# Patient Record
Sex: Male | Born: 1935 | Race: White | Hispanic: No | Marital: Married | Smoking: Former smoker
Health system: Southern US, Community
[De-identification: ages and names within clinical notes are randomized; demographics above are authoritative.]

## PROBLEM LIST (undated history)

## (undated) DIAGNOSIS — F0390 Unspecified dementia without behavioral disturbance: Secondary | ICD-10-CM

## (undated) DIAGNOSIS — S42202P Unspecified fracture of upper end of left humerus, subsequent encounter for fracture with malunion: Secondary | ICD-10-CM

## (undated) DIAGNOSIS — F32A Depression, unspecified: Secondary | ICD-10-CM

## (undated) DIAGNOSIS — G20 Parkinson's disease: Secondary | ICD-10-CM

## (undated) DIAGNOSIS — E785 Hyperlipidemia, unspecified: Secondary | ICD-10-CM

## (undated) DIAGNOSIS — R413 Other amnesia: Secondary | ICD-10-CM

## (undated) DIAGNOSIS — I10 Essential (primary) hypertension: Secondary | ICD-10-CM

## (undated) DIAGNOSIS — G43909 Migraine, unspecified, not intractable, without status migrainosus: Secondary | ICD-10-CM

## (undated) DIAGNOSIS — I679 Cerebrovascular disease, unspecified: Secondary | ICD-10-CM

## (undated) DIAGNOSIS — G20A1 Parkinson's disease without dyskinesia, without mention of fluctuations: Secondary | ICD-10-CM

## (undated) DIAGNOSIS — H8110 Benign paroxysmal vertigo, unspecified ear: Secondary | ICD-10-CM

## (undated) DIAGNOSIS — I639 Cerebral infarction, unspecified: Secondary | ICD-10-CM

## (undated) DIAGNOSIS — C61 Malignant neoplasm of prostate: Secondary | ICD-10-CM

## (undated) DIAGNOSIS — I351 Nonrheumatic aortic (valve) insufficiency: Secondary | ICD-10-CM

## (undated) DIAGNOSIS — I4510 Unspecified right bundle-branch block: Secondary | ICD-10-CM

## (undated) DIAGNOSIS — K5790 Diverticulosis of intestine, part unspecified, without perforation or abscess without bleeding: Secondary | ICD-10-CM

## (undated) DIAGNOSIS — F329 Major depressive disorder, single episode, unspecified: Secondary | ICD-10-CM

## (undated) DIAGNOSIS — Z87898 Personal history of other specified conditions: Secondary | ICD-10-CM

## (undated) HISTORY — DX: Benign paroxysmal vertigo, unspecified ear: H81.10

## (undated) HISTORY — DX: Unspecified dementia, unspecified severity, without behavioral disturbance, psychotic disturbance, mood disturbance, and anxiety: F03.90

## (undated) HISTORY — PX: APPENDECTOMY: SHX54

## (undated) HISTORY — DX: Major depressive disorder, single episode, unspecified: F32.9

## (undated) HISTORY — DX: Hyperlipidemia, unspecified: E78.5

## (undated) HISTORY — DX: Nonrheumatic aortic (valve) insufficiency: I35.1

## (undated) HISTORY — PX: CHOLECYSTECTOMY: SHX55

## (undated) HISTORY — DX: Personal history of other specified conditions: Z87.898

## (undated) HISTORY — DX: Depression, unspecified: F32.A

## (undated) HISTORY — DX: Essential (primary) hypertension: I10

## (undated) HISTORY — DX: Diverticulosis of intestine, part unspecified, without perforation or abscess without bleeding: K57.90

## (undated) HISTORY — DX: Unspecified fracture of upper end of left humerus, subsequent encounter for fracture with malunion: S42.202P

## (undated) HISTORY — DX: Malignant neoplasm of prostate: C61

## (undated) HISTORY — DX: Migraine, unspecified, not intractable, without status migrainosus: G43.909

## (undated) HISTORY — DX: Parkinson's disease without dyskinesia, without mention of fluctuations: G20.A1

## (undated) HISTORY — PX: OTHER SURGICAL HISTORY: SHX169

## (undated) HISTORY — DX: Parkinson's disease: G20

## (undated) HISTORY — PX: TONSILLECTOMY: SUR1361

## (undated) HISTORY — DX: Other amnesia: R41.3

## (undated) HISTORY — PX: CARDIAC CATHETERIZATION: SHX172

## (undated) HISTORY — DX: Cerebrovascular disease, unspecified: I67.9

## (undated) HISTORY — DX: Unspecified right bundle-branch block: I45.10

---

## 1999-07-01 ENCOUNTER — Encounter: Admission: RE | Admit: 1999-07-01 | Discharge: 1999-07-01 | Payer: Self-pay | Admitting: Geriatric Medicine

## 1999-07-01 ENCOUNTER — Encounter: Payer: Self-pay | Admitting: Geriatric Medicine

## 2001-02-25 ENCOUNTER — Ambulatory Visit (HOSPITAL_COMMUNITY): Admission: RE | Admit: 2001-02-25 | Discharge: 2001-02-25 | Payer: Self-pay | Admitting: Gastroenterology

## 2001-06-14 ENCOUNTER — Encounter: Payer: Self-pay | Admitting: Urology

## 2001-06-14 ENCOUNTER — Encounter: Admission: RE | Admit: 2001-06-14 | Discharge: 2001-06-14 | Payer: Self-pay | Admitting: Urology

## 2001-06-27 ENCOUNTER — Ambulatory Visit: Admission: RE | Admit: 2001-06-27 | Discharge: 2001-09-25 | Payer: Self-pay | Admitting: Radiation Oncology

## 2001-10-05 ENCOUNTER — Encounter: Admission: RE | Admit: 2001-10-05 | Discharge: 2001-10-05 | Payer: Self-pay | Admitting: Urology

## 2001-10-05 ENCOUNTER — Encounter: Payer: Self-pay | Admitting: Urology

## 2001-10-05 ENCOUNTER — Ambulatory Visit: Admission: RE | Admit: 2001-10-05 | Discharge: 2001-12-06 | Payer: Self-pay | Admitting: Radiation Oncology

## 2001-11-11 ENCOUNTER — Ambulatory Visit (HOSPITAL_BASED_OUTPATIENT_CLINIC_OR_DEPARTMENT_OTHER): Admission: RE | Admit: 2001-11-11 | Discharge: 2001-11-11 | Payer: Self-pay | Admitting: Urology

## 2001-11-11 ENCOUNTER — Encounter: Payer: Self-pay | Admitting: Urology

## 2007-05-24 ENCOUNTER — Encounter: Admission: RE | Admit: 2007-05-24 | Discharge: 2007-05-24 | Payer: Self-pay | Admitting: Neurology

## 2010-02-16 ENCOUNTER — Encounter: Payer: Self-pay | Admitting: Geriatric Medicine

## 2010-06-13 NOTE — Op Note (Signed)
   NAME:  [name] [name], [name] [name]                        ACCOUNT NO.:  [id number]   MEDICAL RECORD NO.:  [id number]                   PATIENT TYPE:  AMB   LOCATION:  [facility]                                 FACILITY:  [facility]   PHYSICIAN:  Sigmund I. Patsi Sears, M.D.         DATE OF BIRTH:  [birth year]   DATE OF PROCEDURE:  11/11/2001  DATE OF DISCHARGE:                                 OPERATIVE REPORT   PREOPERATIVE DIAGNOSIS:  Adenocarcinoma of the prostate.   POSTOPERATIVE DIAGNOSIS:  Adenocarcinoma of the prostate.   PROCEDURE:  Implantation of iodine seed implantation.   SURGEON:  Sigmund I. Patsi Sears, M.D.   ANESTHESIA:  General LMA.   PREPARATION:  After appropriate preanesthesia, the patient was brought to  the operating room and placed on the operating table in [name] dorsal supine  position, where general LMA anesthesia was introduced.  He was then replaced  in the dorsal lithotomy position and his pubis was prepped with Betadine  solution and draped in the usual fashion.   DESCRIPTION OF PROCEDURE:  The patient underwent 20 needle insertions of  radioactive iodine seeds (total number 78).  The patient had no difficulty  with the surgery, minimal bleeding.  The seeds were placed under both  fluoroscopic and ultrasonic guidance.  [name] Foley catheter was placed after  cystoscopy showed that there was no evidence of seed perforation of the  bladder.  The patient tolerated the procedure well.  He was given [name] B&O  suppository as well as IV Toradol.  He was awakened and taken to the  recovery room in good condition.                                               Sigmund I. Patsi Sears, M.D.    SIT/MEDQ  D:  11/11/2001  T:  11/12/2001  Job:  [id number]   cc:   Wynn Banker, M.D.  [address] [facility]  [place]  [place]  [address]  Fax: [phone]

## 2012-03-17 DIAGNOSIS — G20 Parkinson's disease: Secondary | ICD-10-CM | POA: Insufficient documentation

## 2012-03-17 DIAGNOSIS — G20A1 Parkinson's disease without dyskinesia, without mention of fluctuations: Secondary | ICD-10-CM | POA: Insufficient documentation

## 2012-03-17 DIAGNOSIS — R269 Unspecified abnormalities of gait and mobility: Secondary | ICD-10-CM | POA: Insufficient documentation

## 2012-03-17 DIAGNOSIS — R0602 Shortness of breath: Secondary | ICD-10-CM | POA: Insufficient documentation

## 2012-03-17 DIAGNOSIS — R413 Other amnesia: Secondary | ICD-10-CM | POA: Insufficient documentation

## 2012-06-03 ENCOUNTER — Telehealth: Payer: Self-pay | Admitting: Neurology

## 2012-06-03 NOTE — Telephone Encounter (Signed)
I called the wife. The patient is having increasing problems with bradykinesia. The patient will go up on the Sinemet CR 25/100 taking 1 tablet 4 times daily. We discussed possibly going to Stalevo, but the wife wished to have this alteration in today.

## 2012-06-03 NOTE — Telephone Encounter (Signed)
I called and spoke with the patient's spouse concerning Carbidopa increase. I informed the spouse that per Dr. Clarisa Kindred note "We may switch to Stalevo to keep the Carbidopa dose as low as possible." Spouse stated that Dr. Anne Hahn said that she can call the office and the increase would be made because he would noted in the notes to increase Carbidopa by one tablet.

## 2012-06-03 NOTE — Telephone Encounter (Signed)
Patient's spouse is calling to tell us her husband was a former Dr. Imagene Gurney patient and has been assigned to Dr. Anne Hahn.  She tells me the last visit with [facility] they talked about increasing the patient's medication x 1 tablet. She asks to please give her a call today if at all possible.  Her call back number is [phone].

## 2012-06-08 ENCOUNTER — Other Ambulatory Visit: Payer: Self-pay | Admitting: Neurology

## 2012-06-09 ENCOUNTER — Telehealth: Payer: Self-pay | Admitting: Neurology

## 2012-06-10 ENCOUNTER — Telehealth: Payer: Self-pay | Admitting: Neurology

## 2012-06-10 MED ORDER — CARBIDOPA-LEVODOPA CR 25-100 MG PO TBCR: 1.0000 | EXTENDED_RELEASE_TABLET | Freq: Four times a day (QID) | ORAL | Status: DC

## 2012-06-10 NOTE — Telephone Encounter (Signed)
Please resend Sinemet CR 25/100 taking 1 tablet 4 times daily, the patient pharmacy wont fill the script at 4 times daily.

## 2012-08-15 ENCOUNTER — Telehealth: Payer: Self-pay | Admitting: Neurology

## 2012-08-15 ENCOUNTER — Encounter: Payer: Self-pay | Admitting: Neurology

## 2012-08-15 ENCOUNTER — Ambulatory Visit (INDEPENDENT_AMBULATORY_CARE_PROVIDER_SITE_OTHER): Payer: 59 | Admitting: Neurology

## 2012-08-15 VITALS — BP 123/73 | HR 84 | Ht 65.0 in | Wt 136.0 lb

## 2012-08-15 DIAGNOSIS — R0602 Shortness of breath: Secondary | ICD-10-CM

## 2012-08-15 DIAGNOSIS — R269 Unspecified abnormalities of gait and mobility: Secondary | ICD-10-CM

## 2012-08-15 DIAGNOSIS — G20 Parkinson's disease: Secondary | ICD-10-CM

## 2012-08-15 DIAGNOSIS — R413 Other amnesia: Secondary | ICD-10-CM

## 2012-08-15 DIAGNOSIS — G20A1 Parkinson's disease without dyskinesia, without mention of fluctuations: Secondary | ICD-10-CM

## 2012-08-15 MED ORDER — CARBIDOPA-LEVODOPA-ENTACAPONE 25-100-200 MG PO TABS: 1.0000 | ORAL_TABLET | Freq: Three times a day (TID) | ORAL | Status: DC

## 2012-08-15 NOTE — Telephone Encounter (Signed)
I informed [name] [name] to give the pt. November 21 at 12. Patient is still in the office.

## 2012-08-15 NOTE — Progress Notes (Signed)
Reason for visit: Parkinson's disease  [name] [name] is an 79 y.o. male  History of present illness:  [name] [name] is an 79 year old right-handed white male with a history of Parkinson's disease and dementia. The patient has been on a very low dose of Sinemet taking the 25/100 mg tablet 3 times daily. This dose was increased to 4 times daily in mid-May of 2014. The patient has had increased drowsiness on this medication, with occasional "sundowning" episodes at night. The patient continues to have tremors, right greater than left, and difficulty with getting up out of a chair. He will have intermittent episodes of hoarseness of his voice. The patient walks with a cane, and he has not had any falls. The patient will occasionally choke with eating and drinking. The patient returns for an evaluation.  Past Medical History  Diagnosis Date  . Dementia   . Parkinson's disease   . Benign positional vertigo   . Hypertension   . Depression   . Cerebrovascular disease   . Dyslipidemia   . Diverticulosis   . Bundle branch block, right   . Mild aortic insufficiency   . Migraine headache   . History of seizures     Alzheimer's seizures  . Prostate cancer   . Dementia   . Memory loss     Past Surgical History  Procedure Laterality Date  . Radium seed implants      Prostate cancer  . Cholecystectomy    . Appendectomy    . Tonsillectomy      Family History  Problem Relation Age of Onset  . Heart attack Father     Social history:  reports that he quit smoking about 44 years ago. He does not have any smokeless tobacco history on file. He reports that  drinks alcohol. He reports that he does not use illicit drugs.  Allergies:  Allergies  Allergen Reactions  . Aricept (Donepezil Hcl)   . Zetia (Ezetimibe)   . Lipitor (Atorvastatin)     Medications:  No current outpatient prescriptions on file prior to visit.   No current facility-administered medications on file prior to  visit.    ROS:  Out of a complete 14 system review of symptoms, the patient complains only of the following symptoms, and all other reviewed systems are negative.  Weight loss, fatigue Hearing loss, ringing in the ears, difficulty swallowing Shortness of breath Incontinence  Easy bruising Feeling cold Memory loss, confusion Difficulty swallowing Depression, sleepiness  Blood pressure 123/73, pulse 84, height 5\' 5"  (1.651 m), weight 136 lb (61.689 kg).  Physical Exam  General: The patient is alert and cooperative at the time of the examination.  Skin: No significant peripheral edema is noted.   Neurologic Exam  Mental status: Mini-Mental status examination done today shows a total score of 18/30. The patient is able to name 5 animals in 60 seconds.  Cranial nerves: Facial symmetry is present. Speech is dysphonic, whispery. Extraocular movements are full. Visual fields are full. Masking of the face is noted.  Motor: The patient has good strength in all 4 extremities.  Coordination: The patient has good finger-nose-finger and heel-to-shin bilaterally. A resting tremors noted with both upper extremities, right greater left  Gait and station: The patient is unable to arise from a seated position with arms crossed. Once up, the patient can walk with a cane, with short shuffling steps. The patient shuffles with turns. Tandem gait was not attempted. Romberg is negative. No drift is seen.  Reflexes: Deep tendon reflexes are symmetric.   Assessment/Plan:  One. Parkinson's disease  2. Gait disorder  3. Dementia  The patient and his wife are not interested in going on medications for memory, as he has not gained much benefit from this previously. The patient is having drowsiness with increased dose of Sinemet, we will cut back to taking Stalevo, 25/100/200 mg tablets 3 times daily. The patient will followup in about 4 months. The patient is having increased freezing spells.  Selegiline may be added in the future.  Marlan Palau MD 08/15/2012 7:20 PM  [facility] [address] [place], [place] [address]  Phone [phone] Fax [phone]

## 2012-11-17 ENCOUNTER — Ambulatory Visit: Payer: 59 | Admitting: Podiatry

## 2012-11-28 ENCOUNTER — Ambulatory Visit (INDEPENDENT_AMBULATORY_CARE_PROVIDER_SITE_OTHER): Payer: Medicare Other | Admitting: Podiatry

## 2012-11-28 ENCOUNTER — Encounter: Payer: Self-pay | Admitting: Podiatry

## 2012-11-28 VITALS — BP 129/68 | HR 60 | Resp 12

## 2012-11-28 DIAGNOSIS — M79609 Pain in unspecified limb: Secondary | ICD-10-CM

## 2012-11-28 DIAGNOSIS — B351 Tinea unguium: Secondary | ICD-10-CM

## 2012-11-29 NOTE — Progress Notes (Signed)
Subjective:     Patient ID: [name] [name], male   DOB: January 08, 1928, 79 y.o.   MRN: [id number]  HPI nail disease with discomfort 1-5 both feet that he cannot take care of himself   Review of Systems     Objective:   Physical Exam  Nursing note and vitals reviewed. Constitutional: He is oriented to person, place, and time.  Neurological: He is oriented to person, place, and time.  Skin: Skin is dry.   Thick toenails with debris 1-5 both feet    Assessment:     Mycotic nail infection with pain 1-5 both feet    Plan:     Debridement painful nailbeds 1-5 both feet

## 2012-12-16 ENCOUNTER — Ambulatory Visit (INDEPENDENT_AMBULATORY_CARE_PROVIDER_SITE_OTHER): Payer: Medicare Other | Admitting: Neurology

## 2012-12-16 ENCOUNTER — Encounter: Payer: Self-pay | Admitting: Neurology

## 2012-12-16 VITALS — BP 121/74 | HR 104 | Wt 133.0 lb

## 2012-12-16 DIAGNOSIS — G20 Parkinson's disease: Secondary | ICD-10-CM

## 2012-12-16 DIAGNOSIS — R413 Other amnesia: Secondary | ICD-10-CM

## 2012-12-16 DIAGNOSIS — R269 Unspecified abnormalities of gait and mobility: Secondary | ICD-10-CM

## 2012-12-16 NOTE — Patient Instructions (Signed)

## 2012-12-16 NOTE — Progress Notes (Signed)
Reason for visit: Parkinson's disease  [name] [name] is an 79 y.o. male  History of present illness:  [name] [name] is an 79 year old right-handed white male with a history of Parkinson's disease associated with dementia. The patient has not been able to tolerate higher doses of Stalevo taking the 100 mg tablets, one tablet 3 times daily. The patient gets his medication again at 6 AM, 2 PM, and 9:00 PM. The patient has problems with sundowning, and he may oftentimes get confused as to where he is, or who his wife is. The patient has fallen on occasion. The patient has a cane, but he does not use this consistently. The patient has a lot of trouble getting in and out of his bed, but he so far is able to get up out of a chair fairly well. The patient has no agitation associated with his confusion. In the past, he has not wanted medications for memory.  Past Medical History  Diagnosis Date  . Dementia   . Parkinson's disease   . Benign positional vertigo   . Hypertension   . Depression   . Cerebrovascular disease   . Dyslipidemia   . Diverticulosis   . Bundle branch block, right   . Mild aortic insufficiency   . Migraine headache   . History of seizures     Alzheimer's seizures  . Prostate cancer   . Dementia   . Memory loss     Past Surgical History  Procedure Laterality Date  . Radium seed implants      Prostate cancer  . Cholecystectomy    . Appendectomy    . Tonsillectomy      Family History  Problem Relation Age of Onset  . Heart attack Father     Social history:  reports that he quit smoking about 44 years ago. He has never used smokeless tobacco. He reports that he drinks alcohol. He reports that he does not use illicit drugs.    Allergies  Allergen Reactions  . Aricept [Donepezil Hcl]   . Zetia [Ezetimibe]   . Lipitor [Atorvastatin]     Medications:  Current Outpatient Prescriptions on File Prior to Visit  Medication Sig Dispense Refill  . amLODipine  (NORVASC) 5 MG tablet Take 2.5 mg by mouth daily.       . carbidopa-levodopa-entacapone (STALEVO) 25-100-200 MG per tablet Take 1 tablet by mouth 3 (three) times daily.  90 tablet  5  . FLUoxetine (PROZAC) 20 MG capsule Take 20 mg by mouth daily.       No current facility-administered medications on file prior to visit.    ROS:  Out of a complete 14 system review of symptoms, the patient complains only of the following symptoms, and all other reviewed systems are negative.  Weight loss, fatigue Hearing loss, difficulty swallowing Cough Feeling cold Memory loss, confusion, weakness, slurred speech, dizziness, tremor Gait instability Depression, hallucinations, sleepiness, restless legs  Blood pressure 121/74, pulse 104, weight 133 lb (60.328 kg).  Physical Exam  General: The patient is alert and cooperative at the time of the examination.  Skin: No significant peripheral edema is noted.   Neurologic Exam  Mental status: The Mini-Mental status examination done today shows a total score of 18/30.  Cranial nerves: Facial symmetry is present. Speech is normal, no aphasia or dysarthria is noted. Extraocular movements are full. Visual fields are full, with exception of some restriction of superior gaze. Masking of the face is seen. A jaw tremor  is seen.  Motor: The patient has good strength in all 4 extremities.  Sensory examination: Soft touch sensation is symmetric on the face, arms, and legs.  Coordination: The patient has good finger-nose-finger and heel-to-shin bilaterally. A resting tremors noted with the right greater than left upper extremity.  Gait and station: The patient is able to arise from a seated position with the arms crossed. Once up, the patient has a stooped posture, decreased arm swing, right greater than left. The tremors noted with the right arm with walking. Tandem gait was not attempted. The patient has some freezing with initiation of ambulation. Romberg is  negative. No drift is seen.  Reflexes: Deep tendon reflexes are symmetric.   Assessment/Plan:  1. Parkinson's disease  2. Gait disorder  3. Dementia  The patient is on Stalevo, and he will continue the 100 mg tablets taking 1 tablet 3 times daily. The patient was given a prescription for a hospital bed. The patient does have confusion and hallucinations, but he does not have agitation with this, and I will not add an antipsychotic medication at this point. The patient will followup in 4 or 5 months. The patient is continuing to slowly progress.  Marlan Palau MD 12/16/2012 7:49 PM  [facility] [address] [place], [place] [address]  Phone [phone] Fax [phone]

## 2013-01-31 ENCOUNTER — Emergency Department (HOSPITAL_COMMUNITY): Payer: Medicare Other

## 2013-01-31 ENCOUNTER — Emergency Department (HOSPITAL_COMMUNITY)
Admission: EM | Admit: 2013-01-31 | Discharge: 2013-01-31 | Disposition: A | Discharge status: Home / Self Care | Payer: Medicare Other | Attending: Emergency Medicine | Admitting: Emergency Medicine

## 2013-01-31 DIAGNOSIS — F329 Major depressive disorder, single episode, unspecified: Secondary | ICD-10-CM | POA: Insufficient documentation

## 2013-01-31 DIAGNOSIS — F3289 Other specified depressive episodes: Secondary | ICD-10-CM | POA: Insufficient documentation

## 2013-01-31 DIAGNOSIS — Z8639 Personal history of other endocrine, nutritional and metabolic disease: Secondary | ICD-10-CM | POA: Insufficient documentation

## 2013-01-31 DIAGNOSIS — G20A1 Parkinson's disease without dyskinesia, without mention of fluctuations: Secondary | ICD-10-CM | POA: Insufficient documentation

## 2013-01-31 DIAGNOSIS — G20 Parkinson's disease: Secondary | ICD-10-CM | POA: Insufficient documentation

## 2013-01-31 DIAGNOSIS — Z8673 Personal history of transient ischemic attack (TIA), and cerebral infarction without residual deficits: Secondary | ICD-10-CM | POA: Insufficient documentation

## 2013-01-31 DIAGNOSIS — S0993XA Unspecified injury of face, initial encounter: Secondary | ICD-10-CM | POA: Insufficient documentation

## 2013-01-31 DIAGNOSIS — W1809XA Striking against other object with subsequent fall, initial encounter: Secondary | ICD-10-CM | POA: Insufficient documentation

## 2013-01-31 DIAGNOSIS — S42209A Unspecified fracture of upper end of unspecified humerus, initial encounter for closed fracture: Secondary | ICD-10-CM | POA: Insufficient documentation

## 2013-01-31 DIAGNOSIS — W19XXXA Unspecified fall, initial encounter: Secondary | ICD-10-CM

## 2013-01-31 DIAGNOSIS — Y929 Unspecified place or not applicable: Secondary | ICD-10-CM | POA: Insufficient documentation

## 2013-01-31 DIAGNOSIS — S42202A Unspecified fracture of upper end of left humerus, initial encounter for closed fracture: Secondary | ICD-10-CM

## 2013-01-31 DIAGNOSIS — Z79899 Other long term (current) drug therapy: Secondary | ICD-10-CM | POA: Insufficient documentation

## 2013-01-31 DIAGNOSIS — I10 Essential (primary) hypertension: Secondary | ICD-10-CM | POA: Insufficient documentation

## 2013-01-31 DIAGNOSIS — Z8546 Personal history of malignant neoplasm of prostate: Secondary | ICD-10-CM | POA: Insufficient documentation

## 2013-01-31 DIAGNOSIS — Z8719 Personal history of other diseases of the digestive system: Secondary | ICD-10-CM | POA: Insufficient documentation

## 2013-01-31 DIAGNOSIS — S199XXA Unspecified injury of neck, initial encounter: Secondary | ICD-10-CM

## 2013-01-31 DIAGNOSIS — Z862 Personal history of diseases of the blood and blood-forming organs and certain disorders involving the immune mechanism: Secondary | ICD-10-CM | POA: Insufficient documentation

## 2013-01-31 DIAGNOSIS — Y939 Activity, unspecified: Secondary | ICD-10-CM | POA: Insufficient documentation

## 2013-01-31 DIAGNOSIS — F0390 Unspecified dementia without behavioral disturbance: Secondary | ICD-10-CM | POA: Insufficient documentation

## 2013-01-31 DIAGNOSIS — Z9089 Acquired absence of other organs: Secondary | ICD-10-CM | POA: Insufficient documentation

## 2013-01-31 DIAGNOSIS — IMO0002 Reserved for concepts with insufficient information to code with codable children: Secondary | ICD-10-CM | POA: Insufficient documentation

## 2013-01-31 DIAGNOSIS — Z87891 Personal history of nicotine dependence: Secondary | ICD-10-CM | POA: Insufficient documentation

## 2013-01-31 MED ORDER — HYDROCODONE-ACETAMINOPHEN 5-325 MG PO TABS
2.0000 | ORAL_TABLET | Freq: Once | ORAL | Status: AC
  Administered 2013-01-31: 2 via ORAL
  Filled 2013-01-31: qty 2

## 2013-01-31 MED ORDER — MORPHINE SULFATE 4 MG/ML IJ SOLN
4.0000 mg | Freq: Once | INTRAMUSCULAR | Status: AC
  Administered 2013-01-31: 4 mg via INTRAVENOUS
  Filled 2013-01-31: qty 1

## 2013-01-31 MED ORDER — HYDROCODONE-ACETAMINOPHEN 5-325 MG PO TABS: 1.0000 | ORAL_TABLET | Freq: Four times a day (QID) | ORAL | Status: DC | PRN

## 2013-01-31 NOTE — ED Notes (Signed)
Patient transported to Radiology 

## 2013-01-31 NOTE — ED Notes (Signed)
Per EMS patient with Hx of parkinsons and dementia reports to ED after trip and fall onto carpeted floor, c/o left shoulder, mid lumbar, and right tibia pain. EMS found him prone, angled to the left side. Per EMS, patient's caregiver denies patient hitting heat or losing consciousness, states patient tripped and fell. Some redness on forehead from face resting on carpet. No obvious deformities. Per EMS patient is at mental baseline.

## 2013-01-31 NOTE — ED Notes (Signed)
Bed: WA22 Expected date:  Expected time:  Means of arrival:  Comments: EMS 

## 2013-01-31 NOTE — Progress Notes (Signed)
   CARE MANAGEMENT ED NOTE 01/31/2013  Patient:  [name] [name], [name] [name]   Account Number:  [id number]  Date Initiated:  01/31/2013  Documentation initiated by:  Livia Snellen  Subjective/Objective Assessment:   Patient presents to Ed with injury to left arm post fall at home.     Subjective/Objective Assessment Detail:   Arm sling to be placed to patient's left arm.     Action/Plan:   Xrays completed in ED. Pain medication provided.  Patient to be discharged home with his wife.   Action/Plan Detail:   Anticipated DC Date:  01/31/2013     Status Recommendation to Physician:   Result of Recommendation:    Other ED Services  Consult Working [place]  Other    Choice offered to / List presented to:  C-3 Spouse     HH arranged  HH-1 RN  HH-10 DISEASE MANAGEMENT  HH-2 PT  HH-3 OT  [place]    Status of service:  Completed, signed off  ED Comments:   ED Comments Detail:  EDCM spoke to patient, patient's daughter-in-law, and wife at bedside.  Patient's daughter in law [name] [name] confirms that patient's pcp is Dr. Lajean Manes.  Patient lives with his wife [name] [name] who is able to wash, feed dress and walk without difficulty.  Patient's wife will be able to open the door when home health arrives.  Patient's wife helps patient with his ADL's at home.  Patient with Parkison's and gets increased confusion at night as per daughter in law.  Patient's son and daughter in law live just [name] few blocks from the patient.  Patient's daughter in law reports that the patient's son ( her husband) is not working currently and is Engineer, mining.  [facility] discussed wheelchair and walker with patient's daughter in law.  She reports, "He (the patient) doesn't go out.  The whole house if full of steps.  It would be impossible for him to push himself and there isn't any room.  He wouldn't be  able to use [name] walker either."  Patient's wife reports the patient's Parkinson's doctor gave her [name] prescription for [name] hospital bed.  EDCM asked patient's wife if she wanted [facility] to order hospital bed for her.  Patient's wife refused.  EDCM provided patient's family [name] list of home health agencies in [place] of which [place], [place] was chosen.  EDCM also provided patient's family with list of private duty nursing agencies and advised them it may be an out of pocket expense for them.  EDCM explained that with home health, the patient will recieve a RN, PT, OT aide and social worker for placement needs if any.  Patient's wife and daughter in law thankful for resources.  Patient's wife went on to say that she will not be able to take the patient home this evening.  EDCM informed patient's family, patient does not have [name] qualifying medical reason to stay in the hospital.  Discussed patient with EDP who will tell patient's family the same.  [facility] will refer patient to [facility]. No further CM needs at this itme.

## 2013-01-31 NOTE — Discharge Instructions (Signed)
Fall Prevention and Home Safety Falls cause injuries and can affect all age groups. It is possible to use preventive measures to significantly decrease the likelihood of falls. There are many simple measures which can make your home safer and prevent falls. OUTDOORS  Repair cracks and edges of walkways and driveways.  Remove high doorway thresholds.  Trim shrubbery on the main path into your home.  Have good outside lighting.  Clear walkways of tools, rocks, debris, and clutter.  Check that handrails are not broken and are securely fastened. Both sides of steps should have handrails.  Have leaves, snow, and ice cleared regularly.  Use sand or salt on walkways during winter months.  In the garage, clean up grease or oil spills. BATHROOM  Install night lights.  Install grab bars by the toilet and in the tub and shower.  Use non-skid mats or decals in the tub or shower.  Place a plastic non-slip stool in the shower to sit on, if needed.  Keep floors dry and clean up all water on the floor immediately.  Remove soap buildup in the tub or shower on a regular basis.  Secure bath mats with non-slip, double-sided rug tape.  Remove throw rugs and tripping hazards from the floors. BEDROOMS  Install night lights.  Make sure a bedside light is easy to reach.  Do not use oversized bedding.  Keep a telephone by your bedside.  Have a firm chair with side arms to use for getting dressed.  Remove throw rugs and tripping hazards from the floor. KITCHEN  Keep handles on pots and pans turned toward the center of the stove. Use back burners when possible.  Clean up spills quickly and allow time for drying.  Avoid walking on wet floors.  Avoid hot utensils and knives.  Position shelves so they are not too high or low.  Place commonly used objects within easy reach.  If necessary, use a sturdy step stool with a grab bar when reaching.  Keep electrical cables out of the  way.  Do not use floor polish or wax that makes floors slippery. If you must use wax, use non-skid floor wax.  Remove throw rugs and tripping hazards from the floor. STAIRWAYS  Never leave objects on stairs.  Place handrails on both sides of stairways and use them. Fix any loose handrails. Make sure handrails on both sides of the stairways are as long as the stairs.  Check carpeting to make sure it is firmly attached along stairs. Make repairs to worn or loose carpet promptly.  Avoid placing throw rugs at the top or bottom of stairways, or properly secure the rug with carpet tape to prevent slippage. Get rid of throw rugs, if possible.  Have an electrician put in a light switch at the top and bottom of the stairs. OTHER FALL PREVENTION TIPS  Wear low-heel or rubber-soled shoes that are supportive and fit well. Wear closed toe shoes.  When using a stepladder, make sure it is fully opened and both spreaders are firmly locked. Do not climb a closed stepladder.  Add color or contrast paint or tape to grab bars and handrails in your home. Place contrasting color strips on first and last steps.  Learn and use mobility aids as needed. Install an electrical emergency response system.  Turn on lights to avoid dark areas. Replace light bulbs that burn out immediately. Get light switches that glow.  Arrange furniture to create clear pathways. Keep furniture in the same place.  Firmly attach carpet with non-skid or double-sided tape.  Eliminate uneven floor surfaces.  Select a carpet pattern that does not visually hide the edge of steps.  Be aware of all pets. OTHER HOME SAFETY TIPS  Set the water temperature for 120 F (48.8 C).  Keep emergency numbers on or near the telephone.  Keep smoke detectors on every level of the home and near sleeping areas. Document Released: 01/02/2002 Document Revised: 07/14/2011 Document Reviewed: 04/03/2011 [facility] Patient Information 2014  [place]. Humerus Fracture, Treated with Immobilization The humerus is the large bone in your upper arm. You have a broken (fractured) humerus. These fractures are easily diagnosed with X-rays. TREATMENT  Simple fractures which will heal without disability are treated with simple immobilization. Immobilization means you will wear a cast, splint, or sling. You have a fracture which will do well with immobilization. The fracture will heal well simply by being held in a good position until it is stable enough to begin range of motion exercises. Do not take part in activities which would further injure your arm.  HOME CARE INSTRUCTIONS   Put ice on the injured area.  Put ice in a plastic bag.  Place a towel between your skin and the bag.  Leave the ice on for 15-20 minutes, 03-04 times a day.  If you have a cast:  Do not scratch the skin under the cast using sharp or pointed objects.  Check the skin around the cast every day. You may put lotion on any red or sore areas.  Keep your cast dry and clean.  If you have a splint:  Wear the splint as directed.  Keep your splint dry and clean.  You may loosen the elastic around the splint if your fingers become numb, tingle, or turn cold or blue.  If you have a sling:  Wear the sling as directed.  Do not put pressure on any part of your cast or splint until it is fully hardened.  Your cast or splint can be protected during bathing with a plastic bag. Do not lower the cast or splint into water.  Only take over-the-counter or prescription medicines for pain, discomfort, or fever as directed by your caregiver.  Do range of motion exercises as instructed by your caregiver.  Follow up as directed by your caregiver. This is very important in order to avoid permanent injury or disability and chronic pain. SEEK IMMEDIATE MEDICAL CARE IF:   Your skin or nails in the injured arm turn blue or gray.  Your arm feels cold or numb.  You  develop severe pain in the injured arm.  You are having problems with the medicines you were given. MAKE SURE YOU:   Understand these instructions.  Will watch your condition.  Will get help right away if you are not doing well or get worse. Document Released: 04/20/2000 Document Revised: 04/06/2011 Document Reviewed: 02/26/2010 [facility] Patient Information 2014 [place].

## 2013-01-31 NOTE — ED Provider Notes (Signed)
CSN: [id number]     Arrival date & time 01/31/13  1711 History   First MD Initiated Contact with Patient 01/31/13 1718     Chief Complaint  Patient presents with  . Fall   (Consider location/radiation/quality/duration/timing/severity/associated sxs/prior Treatment) HPI Comments: Pt had mechanical fall onto carpet, states he hit is forehead and chin, no LOC.  Wife states he initially had R shoulder pain, is now complaining of L shoulder/arm pain, low lumbar pain.   Patient is a 80 y.o. male presenting with fall. The history is provided by the patient and the spouse. No language interpreter was used.  Fall This is a new problem. The current episode started 1 to 2 hours ago. The problem occurs rarely. The problem has not changed since onset.Pertinent negatives include no chest pain, no abdominal pain, no headaches and no shortness of breath. Exacerbated by: movement. The symptoms are relieved by rest. Treatments tried: immobilization. The treatment provided mild relief.    Past Medical History  Diagnosis Date  . Dementia   . Parkinson's disease   . Benign positional vertigo   . Hypertension   . Depression   . Cerebrovascular disease   . Dyslipidemia   . Diverticulosis   . Bundle branch block, right   . Mild aortic insufficiency   . Migraine headache   . History of seizures     Alzheimer's seizures  . Prostate cancer   . Dementia   . Memory loss    Past Surgical History  Procedure Laterality Date  . Radium seed implants      Prostate cancer  . Cholecystectomy    . Appendectomy    . Tonsillectomy     Family History  Problem Relation Age of Onset  . Heart attack Father    History  Substance Use Topics  . Smoking status: Former Smoker    Quit date: 01/27/1968  . Smokeless tobacco: Never Used  . Alcohol Use: Yes     Comment: Consumes one beer on a daily basis    Review of Systems  Constitutional: Negative for fever, activity change, appetite change and fatigue.  HENT:  Negative for congestion, facial swelling, rhinorrhea and trouble swallowing.   Eyes: Negative for photophobia and pain.  Respiratory: Negative for cough, chest tightness and shortness of breath.   Cardiovascular: Negative for chest pain and leg swelling.  Gastrointestinal: Negative for nausea, vomiting, abdominal pain, diarrhea and constipation.  Endocrine: Negative for polydipsia and polyuria.  Genitourinary: Negative for dysuria, urgency, decreased urine volume and difficulty urinating.  Musculoskeletal: Positive for arthralgias. Negative for back pain and gait problem.  Skin: Negative for color change, rash and wound.  Allergic/Immunologic: Negative for immunocompromised state.  Neurological: Negative for dizziness, facial asymmetry, speech difficulty, weakness, numbness and headaches.  Psychiatric/Behavioral: Negative for confusion, decreased concentration and agitation.    Allergies  Aricept; Zetia; and Lipitor  Home Medications   Current Outpatient Rx  Name  Route  Sig  Dispense  Refill  . amLODipine (NORVASC) 2.5 MG tablet   Oral   Take 2.5 mg by mouth at bedtime.         . carbidopa-levodopa-entacapone (STALEVO) 25-100-200 MG per tablet   Oral   Take 1 tablet by mouth 3 (three) times daily.   90 tablet   5   . FLUoxetine (PROZAC) 20 MG capsule   Oral   Take 20 mg by mouth daily.         Marland Kitchen HYDROcodone-acetaminophen (NORCO) 5-325 MG per tablet  Oral   Take 1 tablet by mouth every 6 (six) hours as needed.   20 tablet   0    BP 164/97  Pulse 107  Resp 16  SpO2 100% Physical Exam  Constitutional: He is oriented to person, place, and time. He appears well-developed and well-nourished. No distress.  HENT:  Head: Normocephalic. Head is with abrasion.    Mouth/Throat: No oropharyngeal exudate.  Eyes: Pupils are equal, round, and reactive to light.  Neck: Normal range of motion. Neck supple.  Cardiovascular: Normal rate, regular rhythm and normal heart  sounds.  Exam reveals no gallop and no friction rub.   No murmur heard. Pulmonary/Chest: Effort normal and breath sounds normal. No respiratory distress. He has no wheezes. He has no rales.  Abdominal: Soft. Bowel sounds are normal. He exhibits no distension and no mass. There is no tenderness. There is no rebound and no guarding.  Musculoskeletal: Normal range of motion. He exhibits no edema.       Left shoulder: He exhibits tenderness, bony tenderness and swelling.       Back:       Left upper arm: He exhibits bony tenderness and swelling.       Arms: Neurological: He is alert and oriented to person, place, and time.  Skin: Skin is warm and dry.  Psychiatric: He has a normal mood and affect.    ED Course  Procedures (including critical care time) Labs Review Labs Reviewed - No data to display Imaging Review Dg Lumbar Spine Complete  01/31/2013   CLINICAL DATA:  Low back pain post fall  EXAM: LUMBAR SPINE - COMPLETE 4+ VIEW  COMPARISON:  None  FINDINGS: Diffuse osseous demineralization.  Five non-rib-bearing lumbar vertebrae.  Superior endplate compression fracture of L1 vertebral body with approximately 30% anterior height loss, age indeterminate.  No additional fracture, subluxation or bone destruction.  Brachytherapy seed implants at prostate bed.  Scattered atherosclerotic calcifications.  Visualized pelvis intact.  Surgical clips right upper quadrant question cholecystectomy.  IMPRESSION: Age-indeterminate superior endplate compression fracture of L1 vertebral body with approximately 30% anterior height loss.  Osseous demineralization.   Electronically Signed   By: Lavonia Dana M.D.   On: 01/31/2013 19:18   Dg Shoulder 1v Left  01/31/2013   CLINICAL DATA:  Humeral fracture  EXAM: LEFT SHOULDER - 1 VIEW  COMPARISON:  Earlier exams of 01/31/2013  FINDINGS: Displaced fracture of the left humeral neck again identified with overriding.  No dislocation on single AP view.  Bones appear  demineralized.  IMPRESSION: Displaced fracture of surgical neck left humerus as previously noted.   Electronically Signed   By: Lavonia Dana M.D.   On: 01/31/2013 20:13   Dg Shoulder Right  01/31/2013   CLINICAL DATA:  Right shoulder pain post fall  EXAM: RIGHT SHOULDER - 2+ VIEW  COMPARISON:  None.  FINDINGS: Osseous demineralization.  AC joint alignment grossly normal.  No acute fracture, dislocation or bone destruction.  Visualized right ribs intact.  IMPRESSION: Osseous demineralization.  No acute abnormalities.   Electronically Signed   By: Lavonia Dana M.D.   On: 01/31/2013 19:14   Dg Forearm Left  01/31/2013   CLINICAL DATA:  Fall with left arm pain.  EXAM: LEFT FOREARM - 2 VIEW  COMPARISON:  None.  FINDINGS: No acute fracture is identified involving the forearm. No dislocation is seen. No soft tissue abnormalities.  IMPRESSION: No acute fracture of the left forearm.   Electronically Signed   By:  Aletta Edouard M.D.   On: 01/31/2013 19:19   Ct Head Wo Contrast  01/31/2013   CLINICAL DATA:  Fall, history Parkinsons, dementia, hypertension, prostate cancer  EXAM: CT HEAD WITHOUT CONTRAST  CT CERVICAL SPINE WITHOUT CONTRAST  TECHNIQUE: Multidetector CT imaging of the head and cervical spine was performed following the standard protocol without intravenous contrast. Multiplanar CT image reconstructions of the cervical spine were also generated.  COMPARISON:  None.  FINDINGS: CT HEAD FINDINGS  Generalized atrophy.  Normal ventricular morphology.  No midline shift or mass effect.  Small vessel chronic ischemic changes of deep cerebral white matter.  No intracranial hemorrhage, mass lesion, or acute infarction.  Visualized paranasal sinuses and mastoid air cells clear.  Bones unremarkable.  CT CERVICAL SPINE FINDINGS  Prevertebral soft tissues normal thickness.  Bones appear demineralized.  Scattered disc space narrowing.  Vertebral body heights maintained without fracture or subluxation.  Scattered facet  degenerative changes greatest at left C3-C4.  Visualized skullbase intact.  Scattered atherosclerotic calcifications.  Lung apices clear.  IMPRESSION: Atrophy with small vessel chronic ischemic changes of deep cerebral white matter.  No acute intracranial abnormalities.  Degenerative disc and facet disease changes of the cervical spine.  No acute cervical spine abnormalities.   Electronically Signed   By: Lavonia Dana M.D.   On: 01/31/2013 19:09   Ct Cervical Spine Wo Contrast  01/31/2013   CLINICAL DATA:  Fall, history Parkinsons, dementia, hypertension, prostate cancer  EXAM: CT HEAD WITHOUT CONTRAST  CT CERVICAL SPINE WITHOUT CONTRAST  TECHNIQUE: Multidetector CT imaging of the head and cervical spine was performed following the standard protocol without intravenous contrast. Multiplanar CT image reconstructions of the cervical spine were also generated.  COMPARISON:  None.  FINDINGS: CT HEAD FINDINGS  Generalized atrophy.  Normal ventricular morphology.  No midline shift or mass effect.  Small vessel chronic ischemic changes of deep cerebral white matter.  No intracranial hemorrhage, mass lesion, or acute infarction.  Visualized paranasal sinuses and mastoid air cells clear.  Bones unremarkable.  CT CERVICAL SPINE FINDINGS  Prevertebral soft tissues normal thickness.  Bones appear demineralized.  Scattered disc space narrowing.  Vertebral body heights maintained without fracture or subluxation.  Scattered facet degenerative changes greatest at left C3-C4.  Visualized skullbase intact.  Scattered atherosclerotic calcifications.  Lung apices clear.  IMPRESSION: Atrophy with small vessel chronic ischemic changes of deep cerebral white matter.  No acute intracranial abnormalities.  Degenerative disc and facet disease changes of the cervical spine.  No acute cervical spine abnormalities.   Electronically Signed   By: Lavonia Dana M.D.   On: 01/31/2013 19:09   Dg Shoulder Left  01/31/2013   CLINICAL DATA:  Left arm  pain and deformity post fall  EXAM: LEFT SHOULDER - 2+ VIEW  COMPARISON:  None  FINDINGS: Osseous demineralization.  AC joint alignment normal.  Displaced fracture surgical neck left humerus.  No definite dislocation.  Shaft of humerus appears intact but overriding at the fracture site.  Visualized left ribs appear intact.  IMPRESSION: Displaced fracture left humeral neck without definite dislocation.   Electronically Signed   By: Lavonia Dana M.D.   On: 01/31/2013 19:15   Dg Humerus Left  01/31/2013   CLINICAL DATA:  Left arm pain and deformity post fall  EXAM: LEFT HUMERUS - 2+ VIEW  COMPARISON:  None  FINDINGS: Displaced fracture at surgical neck left humerus as previously noted on shoulder radiographs.  Bones appear demineralized.  Remainder humerus appears intact.  Elbow joint alignment grossly normal.  No additional focal osseous abnormalities identified.  IMPRESSION: Proximal left humeral fracture as previously noted.  Osseous demineralization without additional focal abnormality.   Electronically Signed   By: Lavonia Dana M.D.   On: 01/31/2013 19:16    EKG Interpretation   None       MDM   1. Fracture of humerus, proximal, left, closed, initial encounter   2. Fall from standing, initial encounter    Pt is a 80 y.o. male with Pmhx as above who presents with mechanical fall at home.  No LOC, but reports hitting his head.  At baseline mental status.  No recent illness.  On PE, mild abrasion to forehead, ttp over L shoulder, L upper and forearm, R shoulder and low lumbar spine. XR's show displaced fx of L surgical neck of humerus, but located.  No CT head, c-spine acute findings.  XR lumbar shows age indeterminate fx L1. Pt has no tenderness at this location.  Will d/c home w/ norco for pain, have recommended orthopedic outpt f/u as well as PCP f/u.  Case mgmt has spoke to family and will order PT/OT, home health.  Return precautions given for new or worsening symptoms including uncontrolled pain,  fever, AMS.          Neta Ehlers, MD 02/01/13 0010

## 2013-01-31 NOTE — Progress Notes (Signed)
Orthopedic Tech Progress Note Patient Details:  [name] [name] 1927/03/30 [id number] Arm sling to be applied to Left UE after clothes are put on. Ortho Devices Type of Ortho Device: Arm sling Ortho Device/Splint Location: Left UE Ortho Device/Splint Interventions: Application   Asia R Thompson 01/31/2013, 8:46 PM

## 2013-01-31 NOTE — Progress Notes (Signed)
[facility] faxed home health orders to [facility] with confirmation of receipt at 2236pm.  [facility] faxed home health orders to [facility] with confirmation of receipt at 2247pm.  [facility] faxed home health orders to [facility] with confirmation of receipt at 2300pm.

## 2013-02-01 ENCOUNTER — Emergency Department (HOSPITAL_COMMUNITY): Payer: Medicare Other

## 2013-02-01 ENCOUNTER — Encounter (HOSPITAL_COMMUNITY): Payer: Self-pay | Admitting: Emergency Medicine

## 2013-02-01 ENCOUNTER — Emergency Department (HOSPITAL_COMMUNITY)
Admission: EM | Admit: 2013-02-01 | Discharge: 2013-02-01 | Disposition: A | Discharge status: Home / Self Care | Payer: Medicare Other | Attending: Emergency Medicine | Admitting: Emergency Medicine

## 2013-02-01 DIAGNOSIS — IMO0002 Reserved for concepts with insufficient information to code with codable children: Secondary | ICD-10-CM | POA: Insufficient documentation

## 2013-02-01 DIAGNOSIS — S79919A Unspecified injury of unspecified hip, initial encounter: Secondary | ICD-10-CM | POA: Insufficient documentation

## 2013-02-01 DIAGNOSIS — Z8639 Personal history of other endocrine, nutritional and metabolic disease: Secondary | ICD-10-CM | POA: Insufficient documentation

## 2013-02-01 DIAGNOSIS — Y939 Activity, unspecified: Secondary | ICD-10-CM | POA: Insufficient documentation

## 2013-02-01 DIAGNOSIS — Z862 Personal history of diseases of the blood and blood-forming organs and certain disorders involving the immune mechanism: Secondary | ICD-10-CM | POA: Insufficient documentation

## 2013-02-01 DIAGNOSIS — G20A1 Parkinson's disease without dyskinesia, without mention of fluctuations: Secondary | ICD-10-CM | POA: Insufficient documentation

## 2013-02-01 DIAGNOSIS — R296 Repeated falls: Secondary | ICD-10-CM

## 2013-02-01 DIAGNOSIS — Z87891 Personal history of nicotine dependence: Secondary | ICD-10-CM | POA: Insufficient documentation

## 2013-02-01 DIAGNOSIS — S79929A Unspecified injury of unspecified thigh, initial encounter: Principal | ICD-10-CM

## 2013-02-01 DIAGNOSIS — W19XXXA Unspecified fall, initial encounter: Secondary | ICD-10-CM | POA: Insufficient documentation

## 2013-02-01 DIAGNOSIS — F329 Major depressive disorder, single episode, unspecified: Secondary | ICD-10-CM | POA: Insufficient documentation

## 2013-02-01 DIAGNOSIS — Z8719 Personal history of other diseases of the digestive system: Secondary | ICD-10-CM | POA: Insufficient documentation

## 2013-02-01 DIAGNOSIS — G20 Parkinson's disease: Secondary | ICD-10-CM | POA: Insufficient documentation

## 2013-02-01 DIAGNOSIS — F3289 Other specified depressive episodes: Secondary | ICD-10-CM | POA: Insufficient documentation

## 2013-02-01 DIAGNOSIS — S42213A Unspecified displaced fracture of surgical neck of unspecified humerus, initial encounter for closed fracture: Secondary | ICD-10-CM | POA: Insufficient documentation

## 2013-02-01 DIAGNOSIS — R627 Adult failure to thrive: Secondary | ICD-10-CM | POA: Insufficient documentation

## 2013-02-01 DIAGNOSIS — Z79899 Other long term (current) drug therapy: Secondary | ICD-10-CM | POA: Insufficient documentation

## 2013-02-01 DIAGNOSIS — I10 Essential (primary) hypertension: Secondary | ICD-10-CM | POA: Insufficient documentation

## 2013-02-01 DIAGNOSIS — F0280 Dementia in other diseases classified elsewhere without behavioral disturbance: Secondary | ICD-10-CM | POA: Insufficient documentation

## 2013-02-01 DIAGNOSIS — Z8673 Personal history of transient ischemic attack (TIA), and cerebral infarction without residual deficits: Secondary | ICD-10-CM | POA: Insufficient documentation

## 2013-02-01 DIAGNOSIS — Y92009 Unspecified place in unspecified non-institutional (private) residence as the place of occurrence of the external cause: Secondary | ICD-10-CM | POA: Insufficient documentation

## 2013-02-01 DIAGNOSIS — G309 Alzheimer's disease, unspecified: Secondary | ICD-10-CM | POA: Insufficient documentation

## 2013-02-01 DIAGNOSIS — Z8546 Personal history of malignant neoplasm of prostate: Secondary | ICD-10-CM | POA: Insufficient documentation

## 2013-02-01 LAB — URINALYSIS, ROUTINE W REFLEX MICROSCOPIC
GLUCOSE, UA: NEGATIVE mg/dL
KETONES UR: 40 mg/dL — AB
LEUKOCYTES UA: NEGATIVE
NITRITE: NEGATIVE
PROTEIN: 30 mg/dL — AB
Specific Gravity, Urine: 1.028 (ref 1.005–1.030)
Urobilinogen, UA: 0.2 mg/dL (ref 0.0–1.0)
pH: 6 (ref 5.0–8.0)

## 2013-02-01 LAB — COMPREHENSIVE METABOLIC PANEL
ALT: 16 U/L (ref 0–53)
AST: 28 U/L (ref 0–37)
Albumin: 3.5 g/dL (ref 3.5–5.2)
Alkaline Phosphatase: 41 U/L (ref 39–117)
BILIRUBIN TOTAL: 0.8 mg/dL (ref 0.3–1.2)
BUN: 18 mg/dL (ref 6–23)
CHLORIDE: 97 meq/L (ref 96–112)
CO2: 22 mEq/L (ref 19–32)
Calcium: 8.7 mg/dL (ref 8.4–10.5)
Creatinine, Ser: 0.85 mg/dL (ref 0.50–1.35)
GFR calc non Af Amer: 78 mL/min — ABNORMAL LOW (ref >90)
Glucose, Bld: 131 mg/dL — ABNORMAL HIGH (ref 70–99)
Potassium: 3.6 mEq/L — ABNORMAL LOW (ref 3.7–5.3)
SODIUM: 132 meq/L — AB (ref 137–147)
Total Protein: 6.3 g/dL (ref 6.0–8.3)

## 2013-02-01 LAB — CBC
HEMATOCRIT: 34.7 % — AB (ref 39.0–52.0)
Hemoglobin: 12.5 g/dL — ABNORMAL LOW (ref 13.0–17.0)
MCH: 32 pg (ref 26.0–34.0)
MCHC: 36 g/dL (ref 30.0–36.0)
MCV: 88.7 fL (ref 78.0–100.0)
Platelets: 184 10*3/uL (ref 150–400)
RBC: 3.91 MIL/uL — AB (ref 4.22–5.81)
RDW: 12.6 % (ref 11.5–15.5)
WBC: 13.1 10*3/uL — AB (ref 4.0–10.5)

## 2013-02-01 LAB — URINE MICROSCOPIC-ADD ON

## 2013-02-01 MED ORDER — HYDROCODONE-ACETAMINOPHEN 5-325 MG PO TABS
2.0000 | ORAL_TABLET | Freq: Once | ORAL | Status: AC
  Administered 2013-02-01: 2 via ORAL
  Filled 2013-02-01: qty 2

## 2013-02-01 MED ORDER — LORAZEPAM 1 MG PO TABS
1.0000 mg | ORAL_TABLET | Freq: Once | ORAL | Status: AC
  Administered 2013-02-01: 1 mg via ORAL
  Filled 2013-02-01: qty 1

## 2013-02-01 MED ORDER — ACETAMINOPHEN 325 MG PO TABS
650.0000 mg | ORAL_TABLET | Freq: Once | ORAL | Status: AC
  Administered 2013-02-01: 650 mg via ORAL
  Filled 2013-02-01: qty 2

## 2013-02-01 NOTE — ED Notes (Signed)
Per EMS pt fell on the 6th and broke his left humerous  Pt was at home tonight and fell again on a carpeted floor  Pt was unable to get up  Pt has left arm in a sling  Pt has alzheimer's and is unable to tell EMS where he is having pain  EMS immobilized pt as a precaution Pt has dried blood noted to his face  Abrasion noted to his forehead  No lacerations noted to head  Fall was unwitnessed  Wife found pt on floor

## 2013-02-01 NOTE — Progress Notes (Addendum)
PHYSICAL THERAPY EVALUATION   02/01/13 0941  PT Visit Information  Assistance Needed +2  History of Present Illness 80 yo male admitted with multiple falls, acute L displaced humeral fx. Hx of Parkinson's, dementia. Lived at home with wife.    Clinical Impression: On eval, pt required Max assist of 2 for mobility-able to ambulate ~10-15 feet with significant difficulty. HIGH RISK FOR FALLS. Will need SNF for continued rehab.   Precautions  Precautions Fall  Required Braces or Orthoses Sling (L UE)  Restrictions  Weight Bearing Restrictions Yes  LUE Weight Bearing NWB  Home Living  Family/patient expects to be discharged to: Private residence  Living Arrangements Spouse/significant other  Additional Comments Pt unable to provide PLOF, home environment info  Prior Function  Level of Independence Needs assistance  Communication  Communication Other (comment) (difficulty to assess. Pt kept eyes closed and mumbled a few words. )  Cognition  Arousal/Alertness Lethargic  Behavior During Therapy Anxious (at times)  Overall Cognitive Status Difficult to assess  Upper Extremity Assessment  Upper Extremity Assessment LUE deficits/detail  LUE Unable to fully assess due to immobilization  Lower Extremity Assessment  Lower Extremity Assessment Generalized weakness  Cervical / Trunk Assessment  Cervical / Trunk Assessment Kyphotic  Bed Mobility  Overal bed mobility Needs Assistance  Bed Mobility Rolling;Supine to Sit;Sit to Supine  Rolling Max assist  Supine to sit Max assist;+2 for physical assistance  Sit to supine Max assist;+2 for physical assistance  General bed mobility comments Increased time. Decreased initiation of task. Multimodal cues for technique.   Transfers  Overall transfer level Needs assistance  Transfers Sit to/from Stand  Sit to Stand Mod assist;+2 physical assistance  General transfer comment Assist to rise, stabilize, control descent. Weight shifted posteriorly  outside of BOS.   Ambulation/Gait  Ambulation/Gait assistance Max assist  Ambulation Distance (Feet) 15 Feet  Assistive device 1 person hand held assist  Gait Pattern/deviations Festinating  General Gait Details 1 hand assist due to L UE in sling. 1 seated rest break needed due to pt requesting to sit and pt would not take any more steps. Last few steps to bed required increased assistance to safely reach bed.   Balance  Overall balance assessment History of Falls  Sitting-balance support Single extremity supported  Sitting balance-Leahy Scale Poor  Postural control Posterior lean  Standing balance support Single extremity supported  Standing balance-Leahy Scale Poor  PT - End of Session  Equipment Utilized During Treatment Gait belt  Activity Tolerance Patient limited by pain;Patient limited by lethargy  Patient left with call bell/phone within reach;Other (comment);in bed (with both rails of ED bed up)  PT Assessment  PT Recommendation/Assessment Patient needs continued PT services  PT Problem List Decreased strength;Decreased activity tolerance;Decreased balance;Decreased mobility;Decreased coordination;Decreased cognition;Decreased safety awareness;Decreased knowledge of precautions;Decreased knowledge of use of DME;Pain  PT Therapy Diagnosis  Difficulty walking;Abnormality of gait;Generalized weakness;Acute pain  PT Plan  PT Frequency Min 2X/week  PT Treatment/Interventions DME instruction;Gait training;Functional mobility training;Therapeutic activities;Therapeutic exercise;Balance training;Patient/family education  PT Recommendation  Follow Up Recommendations SNF  PT equipment None recommended by PT  Individuals Consulted  Consulted and Agree with Results and Recommendations Patient unable/family or caregiver not available  Acute Rehab PT Goals  Patient Stated Goal none stated  PT Goal Formulation Patient unable to participate in goal setting  Time For Goal Achievement  02/15/13  Potential to Achieve Goals Good  PT Time Calculation  PT Start Time 0901  PT Stop Time 0914  PT Time Calculation (min) 13 min  PT G-Codes **NOT FOR INPATIENT CLASS**  Functional Assessment Tool Used clinical judgement  Functional Limitation Mobility: Walking and moving around  Mobility: Walking and Moving Around Current Status (Q7591) CM  Mobility: Walking and Moving Around Goal Status (M3846) CJ   Weston Anna, MPT [phone]

## 2013-02-01 NOTE — Progress Notes (Signed)
   CARE MANAGEMENT ED NOTE 02/01/2013  Patient:  [name] [name],[name] [name]   Account Number:  [id number]  Date Initiated:  02/01/2013  Documentation initiated by:  Jackelyn Poling  Subjective/Objective Assessment:   80 yr old male [facility] medicare complete pt fell on the 6th & broke his left humerous  home tonight & fell again on [name] carpeted floor  Pt was unable to get up   has left arm in [name] sling PMH alzheimer's returned to [facility] ED after earlier 01/31/13 d/c     Subjective/Objective Assessment Detail:   c/o pain in his right hip & his left arm, also has an abrasion to his forehead     Action/Plan:   on 02/01/13 pt referral sent to [place], [place] home health agencies. AM ED CM spoke with staff at [facility] and [place] Then refaxed clinicals/referral to updated fax numbers Contact made with [facility] staff who is   Action/Plan Detail:   aware and following pt Appears pt has been seen by [facility] PT with recommendation of snf and SW consult ordered ?possible placement to snf Received fax confirmations for clinicals to [facility] and [facility] prior to 1020   Anticipated DC Date:       Status Recommendation to Physician:   Result of Recommendation:    Other ED Services  Consult Working [place]  Other    Choice offered to / List presented to:       [facility] arranged  HH-1 RN  [place]      [place] agency  [place].    Status of service:  Completed, signed off  ED Comments:   ED Comments Detail:

## 2013-02-01 NOTE — ED Provider Notes (Signed)
Medical screening examination/treatment/procedure(s) were conducted as a shared visit with non-physician practitioner(s) and myself.  I personally evaluated the patient during the encounter.  EKG Interpretation   None      Patient presents following a fall.  Hx of dementia.  Seen earlier today following a fall and dx with humerus fracture.  No new obvious evidence of trauma.  Patient has dementia and is non contributory to history and is agitated.  W/U neg.  Patient's wife was injured during the fall as well and has an acute wrist fracture.  She is elderly and states that she will be unable to care for him at home.  Social work and Case management to assess patient for placement later this morning.  Merryl Hacker, MD 02/01/13 [phone]

## 2013-02-01 NOTE — ED Notes (Signed)
c-collar removed per MD 

## 2013-02-01 NOTE — ED Provider Notes (Signed)
CSN: [id number]     Arrival date & time 02/01/13  2536 History   First MD Initiated Contact with Patient 02/01/13 0326     Chief Complaint  Patient presents with  . Fall   (Consider location/radiation/quality/duration/timing/severity/associated sxs/prior Treatment) HPI Comments: Patient was seen earlier today after a fall, was noted to have a broken left humerus.  Discharged home.  He again fell.  This evening after he became agitated, and tried getting out of bed on his own.  He does suffer from Alzheimer's.  At this time.  He is complaining of pain in his right hip and his left arm.  He also has an abrasion to his for head.  He is unable to communicate all of his knees or report of exactly what happened to to his underlying medical condition.  Patient is a 80 y.o. male presenting with fall. The history is provided by a relative.  Fall This is a recurrent problem. The current episode started today.    Past Medical History  Diagnosis Date  . Dementia   . Parkinson's disease   . Benign positional vertigo   . Hypertension   . Depression   . Cerebrovascular disease   . Dyslipidemia   . Diverticulosis   . Bundle branch block, right   . Mild aortic insufficiency   . Migraine headache   . History of seizures     Alzheimer's seizures  . Prostate cancer   . Dementia   . Memory loss    Past Surgical History  Procedure Laterality Date  . Radium seed implants      Prostate cancer  . Cholecystectomy    . Appendectomy    . Tonsillectomy     Family History  Problem Relation Age of Onset  . Heart attack Father    History  Substance Use Topics  . Smoking status: Former Smoker    Quit date: 01/27/1968  . Smokeless tobacco: Never Used  . Alcohol Use: No    Review of Systems  Unable to perform ROS: Other  Musculoskeletal: Positive for back pain.  Skin: Positive for wound.  All other systems reviewed and are negative.    Allergies  Aricept; Zetia; and Lipitor  Home  Medications   Current Outpatient Rx  Name  Route  Sig  Dispense  Refill  . amLODipine (NORVASC) 2.5 MG tablet   Oral   Take 2.5 mg by mouth at bedtime.         . carbidopa-levodopa-entacapone (STALEVO) 25-100-200 MG per tablet   Oral   Take 1 tablet by mouth 3 (three) times daily.   90 tablet   5   . FLUoxetine (PROZAC) 20 MG capsule   Oral   Take 20 mg by mouth daily.         Marland Kitchen HYDROcodone-acetaminophen (NORCO) 5-325 MG per tablet   Oral   Take 1 tablet by mouth every 6 (six) hours as needed.   20 tablet   0    BP 149/84  Pulse 112  Temp(Src) 97.6 F (36.4 C) (Oral)  Resp 28  SpO2 100% Physical Exam  Nursing note and vitals reviewed. Constitutional: He appears well-developed.  HENT:  Head: Normocephalic.  Abrasion midline fore head  Eyes: Pupils are equal, round, and reactive to light.  Neck:  In c-collar  Cardiovascular: Normal rate.   Pulmonary/Chest: Effort normal.  Abdominal: Soft. He exhibits no distension. There is no tenderness.  Musculoskeletal:  Pain in his left upper arm, and the right  hip area  Skin: Skin is warm.    ED Course  Procedures (including critical care time) Labs Review Labs Reviewed - No data to display Imaging Review Dg Lumbar Spine Complete  01/31/2013   CLINICAL DATA:  Low back pain post fall  EXAM: LUMBAR SPINE - COMPLETE 4+ VIEW  COMPARISON:  None  FINDINGS: Diffuse osseous demineralization.  Five non-rib-bearing lumbar vertebrae.  Superior endplate compression fracture of L1 vertebral body with approximately 30% anterior height loss, age indeterminate.  No additional fracture, subluxation or bone destruction.  Brachytherapy seed implants at prostate bed.  Scattered atherosclerotic calcifications.  Visualized pelvis intact.  Surgical clips right upper quadrant question cholecystectomy.  IMPRESSION: Age-indeterminate superior endplate compression fracture of L1 vertebral body with approximately 30% anterior height loss.  Osseous  demineralization.   Electronically Signed   By: Lavonia Dana M.D.   On: 01/31/2013 19:18   Dg Pelvis 1-2 Views  02/01/2013   CLINICAL DATA:  Fall with humerus pain and pelvic pain.  EXAM: PELVIS - 1-2 VIEW  COMPARISON:  None.  FINDINGS: There is no evidence of pelvic fracture or diastasis. No other pelvic bone lesions are seen. Prostate brachytherapy seeds.  IMPRESSION: No evidence of osseous injury.   Electronically Signed   By: Jorje Guild M.D.   On: 02/01/2013 05:05   Dg Shoulder 1v Left  01/31/2013   CLINICAL DATA:  Humeral fracture  EXAM: LEFT SHOULDER - 1 VIEW  COMPARISON:  Earlier exams of 01/31/2013  FINDINGS: Displaced fracture of the left humeral neck again identified with overriding.  No dislocation on single AP view.  Bones appear demineralized.  IMPRESSION: Displaced fracture of surgical neck left humerus as previously noted.   Electronically Signed   By: Lavonia Dana M.D.   On: 01/31/2013 20:13   Dg Shoulder Right  01/31/2013   CLINICAL DATA:  Right shoulder pain post fall  EXAM: RIGHT SHOULDER - 2+ VIEW  COMPARISON:  None.  FINDINGS: Osseous demineralization.  AC joint alignment grossly normal.  No acute fracture, dislocation or bone destruction.  Visualized right ribs intact.  IMPRESSION: Osseous demineralization.  No acute abnormalities.   Electronically Signed   By: Lavonia Dana M.D.   On: 01/31/2013 19:14   Dg Forearm Left  01/31/2013   CLINICAL DATA:  Fall with left arm pain.  EXAM: LEFT FOREARM - 2 VIEW  COMPARISON:  None.  FINDINGS: No acute fracture is identified involving the forearm. No dislocation is seen. No soft tissue abnormalities.  IMPRESSION: No acute fracture of the left forearm.   Electronically Signed   By: Aletta Edouard M.D.   On: 01/31/2013 19:19   Ct Head Wo Contrast  02/01/2013   CLINICAL DATA:  Found down, unconscious with forehead laceration.  EXAM: CT HEAD WITHOUT CONTRAST  CT CERVICAL SPINE WITHOUT CONTRAST  TECHNIQUE: Multidetector CT imaging of the head and  cervical spine was performed following the standard protocol without intravenous contrast. Multiplanar CT image reconstructions of the cervical spine were also generated.  COMPARISON:  CT of the head and cervical spine January 31, 2013.  FINDINGS: CT HEAD FINDINGS  The ventricles and sulci are normal for age. No intraparenchymal hemorrhage, mass effect nor midline shift. Patchy supratentorial white matter hypodensities are within normal range for patient's age and though non-specific suggest sequelae of chronic small vessel ischemic disease. No acute large vascular territory infarcts.  No abnormal extra-axial fluid collections. Basal cisterns are patent. Moderate calcific atherosclerosis of the carotid siphons.  No skull  fracture. Visualized paranasal sinuses and mastoid aircells are well-aerated. The included ocular globes and orbital contents are non-suspicious. The patient is edentulous. Moderate temporomandibular osteoarthrosis.  CT CERVICAL SPINE FINDINGS  Cervical vertebral bodies and posterior elements are intact and aligned with straightened cervical lordosis. Moderate to severe C4-5 through C6-7 degenerative disc disease. C1-2 articulation maintained with moderate arthropathy. No destructive bony lesions. Moderate right, mild left carotid bifurcations calcification. Paraspinal soft tissues are nonsuspicious.  Degenerative disc disease and facet arthropathy result in mild canal stenosis at see 5 6. Moderate to severe left C3-4, left C5-6 and right C6-7 neural foraminal narrowing.  IMPRESSION: CT head: No acute intracranial process.  Involutional changes. Mild to moderate white matter changes suggest chronic small vessel ischemic disease.  CT cervical spine: Straightened cervical lordosis without acute fracture deformity nor malalignment.   Electronically Signed   By: Elon Alas   On: 02/01/2013 05:14   Ct Head Wo Contrast  01/31/2013   CLINICAL DATA:  Fall, history Parkinsons, dementia, hypertension,  prostate cancer  EXAM: CT HEAD WITHOUT CONTRAST  CT CERVICAL SPINE WITHOUT CONTRAST  TECHNIQUE: Multidetector CT imaging of the head and cervical spine was performed following the standard protocol without intravenous contrast. Multiplanar CT image reconstructions of the cervical spine were also generated.  COMPARISON:  None.  FINDINGS: CT HEAD FINDINGS  Generalized atrophy.  Normal ventricular morphology.  No midline shift or mass effect.  Small vessel chronic ischemic changes of deep cerebral white matter.  No intracranial hemorrhage, mass lesion, or acute infarction.  Visualized paranasal sinuses and mastoid air cells clear.  Bones unremarkable.  CT CERVICAL SPINE FINDINGS  Prevertebral soft tissues normal thickness.  Bones appear demineralized.  Scattered disc space narrowing.  Vertebral body heights maintained without fracture or subluxation.  Scattered facet degenerative changes greatest at left C3-C4.  Visualized skullbase intact.  Scattered atherosclerotic calcifications.  Lung apices clear.  IMPRESSION: Atrophy with small vessel chronic ischemic changes of deep cerebral white matter.  No acute intracranial abnormalities.  Degenerative disc and facet disease changes of the cervical spine.  No acute cervical spine abnormalities.   Electronically Signed   By: Lavonia Dana M.D.   On: 01/31/2013 19:09   Ct Cervical Spine Wo Contrast  02/01/2013   CLINICAL DATA:  Found down, unconscious with forehead laceration.  EXAM: CT HEAD WITHOUT CONTRAST  CT CERVICAL SPINE WITHOUT CONTRAST  TECHNIQUE: Multidetector CT imaging of the head and cervical spine was performed following the standard protocol without intravenous contrast. Multiplanar CT image reconstructions of the cervical spine were also generated.  COMPARISON:  CT of the head and cervical spine January 31, 2013.  FINDINGS: CT HEAD FINDINGS  The ventricles and sulci are normal for age. No intraparenchymal hemorrhage, mass effect nor midline shift. Patchy  supratentorial white matter hypodensities are within normal range for patient's age and though non-specific suggest sequelae of chronic small vessel ischemic disease. No acute large vascular territory infarcts.  No abnormal extra-axial fluid collections. Basal cisterns are patent. Moderate calcific atherosclerosis of the carotid siphons.  No skull fracture. Visualized paranasal sinuses and mastoid aircells are well-aerated. The included ocular globes and orbital contents are non-suspicious. The patient is edentulous. Moderate temporomandibular osteoarthrosis.  CT CERVICAL SPINE FINDINGS  Cervical vertebral bodies and posterior elements are intact and aligned with straightened cervical lordosis. Moderate to severe C4-5 through C6-7 degenerative disc disease. C1-2 articulation maintained with moderate arthropathy. No destructive bony lesions. Moderate right, mild left carotid bifurcations calcification. Paraspinal soft tissues are  nonsuspicious.  Degenerative disc disease and facet arthropathy result in mild canal stenosis at see 5 6. Moderate to severe left C3-4, left C5-6 and right C6-7 neural foraminal narrowing.  IMPRESSION: CT head: No acute intracranial process.  Involutional changes. Mild to moderate white matter changes suggest chronic small vessel ischemic disease.  CT cervical spine: Straightened cervical lordosis without acute fracture deformity nor malalignment.   Electronically Signed   By: Elon Alas   On: 02/01/2013 05:14   Ct Cervical Spine Wo Contrast  01/31/2013   CLINICAL DATA:  Fall, history Parkinsons, dementia, hypertension, prostate cancer  EXAM: CT HEAD WITHOUT CONTRAST  CT CERVICAL SPINE WITHOUT CONTRAST  TECHNIQUE: Multidetector CT imaging of the head and cervical spine was performed following the standard protocol without intravenous contrast. Multiplanar CT image reconstructions of the cervical spine were also generated.  COMPARISON:  None.  FINDINGS: CT HEAD FINDINGS  Generalized  atrophy.  Normal ventricular morphology.  No midline shift or mass effect.  Small vessel chronic ischemic changes of deep cerebral white matter.  No intracranial hemorrhage, mass lesion, or acute infarction.  Visualized paranasal sinuses and mastoid air cells clear.  Bones unremarkable.  CT CERVICAL SPINE FINDINGS  Prevertebral soft tissues normal thickness.  Bones appear demineralized.  Scattered disc space narrowing.  Vertebral body heights maintained without fracture or subluxation.  Scattered facet degenerative changes greatest at left C3-C4.  Visualized skullbase intact.  Scattered atherosclerotic calcifications.  Lung apices clear.  IMPRESSION: Atrophy with small vessel chronic ischemic changes of deep cerebral white matter.  No acute intracranial abnormalities.  Degenerative disc and facet disease changes of the cervical spine.  No acute cervical spine abnormalities.   Electronically Signed   By: Lavonia Dana M.D.   On: 01/31/2013 19:09   Dg Shoulder Left  01/31/2013   CLINICAL DATA:  Left arm pain and deformity post fall  EXAM: LEFT SHOULDER - 2+ VIEW  COMPARISON:  None  FINDINGS: Osseous demineralization.  AC joint alignment normal.  Displaced fracture surgical neck left humerus.  No definite dislocation.  Shaft of humerus appears intact but overriding at the fracture site.  Visualized left ribs appear intact.  IMPRESSION: Displaced fracture left humeral neck without definite dislocation.   Electronically Signed   By: Lavonia Dana M.D.   On: 01/31/2013 19:15   Dg Humerus Left  02/01/2013   CLINICAL DATA:  Fall with known fracture  EXAM: LEFT HUMERUS - 2+ VIEW  COMPARISON:  01/31/2013  FINDINGS: Acute fracture through the surgical neck of the humerus. Given differences in obliquity, displacement and impaction is relatively similar, with the distal fragment anteriorly located. The glenohumeral joint remains located. No new fracture seen. Osteopenia.  IMPRESSION: Known surgical neck left humerus fracture.  Displacement and impaction is similar to previous. No new fracture.   Electronically Signed   By: Jorje Guild M.D.   On: 02/01/2013 05:08   Dg Humerus Left  01/31/2013   CLINICAL DATA:  Left arm pain and deformity post fall  EXAM: LEFT HUMERUS - 2+ VIEW  COMPARISON:  None  FINDINGS: Displaced fracture at surgical neck left humerus as previously noted on shoulder radiographs.  Bones appear demineralized.  Remainder humerus appears intact.  Elbow joint alignment grossly normal.  No additional focal osseous abnormalities identified.  IMPRESSION: Proximal left humeral fracture as previously noted.  Osseous demineralization without additional focal abnormality.   Electronically Signed   By: Lavonia Dana M.D.   On: 01/31/2013 19:16    EKG Interpretation  None       MDM  No diagnosis found.  X-rays have been reviewed.  No new fractures identified.  Discussion with family.  They are requesting placement for this patient, as they are unable to care for him at home, and with wife's new wrist fracture requiring manipulation and casting decreases her ability to care for him as well.  I contacted social work for evaluation for nursing home placement  Social work has been contacted and will calm assist with placement today in [place], and has been reviewed and he will be able to be placed from the emergency [place], NP 02/01/13 0535  Garald Balding, NP 02/01/13 [phone]

## 2013-02-01 NOTE — ED Notes (Signed)
Attempted to feed patient again, he refused. Taking fluids without difficulty

## 2013-02-01 NOTE — ED Notes (Signed)
Tray ordered for pt.

## 2013-02-01 NOTE — ED Notes (Signed)
Patient  Has been agitated as evidenced by being fidgety. Pain medication given as he did acknowledge pain. Unable to tell how much pain. Patient's sheets changed and repositioned. Barrier cream applied to bottom.

## 2013-02-01 NOTE — Progress Notes (Signed)
Pt family chose [facility]. Patient to be transported by ptar. Rn can call report to [phone]  .Dorathy Kinsman, Ava  ED CSW .02/01/2013 1521pm

## 2013-02-01 NOTE — ED Notes (Signed)
Unable to in and out cath pt.

## 2013-02-01 NOTE — Progress Notes (Signed)
CSW received consult for placement. Patient has history of Alzheimer's and over the past 24 hours patient has had increased falls. Pt was seen in the emergency department and treated for a broken humerus went home, and then returned after a second fall fortunately not resulting in new fractures. However patient wife fractured her wrist when attempting to care for patient. CSW spoke with pt wife who is at home resting who is interested in patient going to skilled nursing for short term rehab. Patient wife interested in placement at [facility] and is open to other facilities in [employer] as well. CSW spoke with EDP who ordered PT evaluation. Per discussion with nurse, PT evaluation has been completed, csw awaiting note to be completed to assist with placement process. Pt will need insurance authorization for placement however placement anticipated for later today.   Dorathy Kinsman, LCSW [phone]  ED CSW .02/01/2013 931am

## 2013-02-01 NOTE — Progress Notes (Signed)
WLED Cm received return call from [place], Darylene Price at 1045 today.  Pt's insurance coverage is out of network therefore [facility] would not be able to assist pt Return call at [phone] from [place] informed her of change in disposition to possible snf for pt

## 2013-02-01 NOTE — ED Notes (Signed)
Patient is now sleeping. Was fed about 12noon. Ate approx 40%. Will continue to encourage PO intake

## 2013-02-01 NOTE — Progress Notes (Addendum)
CSW provided patient wife with bed offers, [facility], [facility], [facility], and [facility] considering. Patient wife wanted to discuss these with patient son and daughter in law. CSW to speak with patient wife again at 215pm.   .Rickey Barbara [phone]  ED CSW .02/01/2013 1304pm   Addendum: CSW updated patient wife with [facility] bed offer. Pt daughter in law going to see [facility] and [place] and will touch base with csw at [phone].   Dorathy Kinsman, LCSW [phone]  ED CSW .02/01/2013 1324pm

## 2013-02-01 NOTE — ED Notes (Signed)
Attempted to insert in and out cath to obtain urine sample. Catheter would no advance past prostate with multiple attempts. RN at bedside during attempt.

## 2013-02-01 NOTE — Discharge Instructions (Signed)
Transport patient to his new extended care facility. Fall precautions. Follow up with primary care doctor in the next couple days.  For proximal humerus fracture, wear sling/shoulder immobilizer, take your pain medication as need, and follow up with orthopedist in the next 1-2 weeks.  Return to ER if worse, new symptoms, fevers, other concern.    Fall Prevention and Home Safety Falls cause injuries and can affect all age groups. It is possible to use preventive measures to significantly decrease the likelihood of falls. There are many simple measures which can make your home safer and prevent falls. OUTDOORS  Repair cracks and edges of walkways and driveways.  Remove high doorway thresholds.  Trim shrubbery on the main path into your home.  Have good outside lighting.  Clear walkways of tools, rocks, debris, and clutter.  Check that handrails are not broken and are securely fastened. Both sides of steps should have handrails.  Have leaves, snow, and ice cleared regularly.  Use sand or salt on walkways during winter months.  In the garage, clean up grease or oil spills. BATHROOM  Install night lights.  Install grab bars by the toilet and in the tub and shower.  Use non-skid mats or decals in the tub or shower.  Place a plastic non-slip stool in the shower to sit on, if needed.  Keep floors dry and clean up all water on the floor immediately.  Remove soap buildup in the tub or shower on a regular basis.  Secure bath mats with non-slip, double-sided rug tape.  Remove throw rugs and tripping hazards from the floors. BEDROOMS  Install night lights.  Make sure a bedside light is easy to reach.  Do not use oversized bedding.  Keep a telephone by your bedside.  Have a firm chair with side arms to use for getting dressed.  Remove throw rugs and tripping hazards from the floor. KITCHEN  Keep handles on pots and pans turned toward the center of the stove. Use back  burners when possible.  Clean up spills quickly and allow time for drying.  Avoid walking on wet floors.  Avoid hot utensils and knives.  Position shelves so they are not too high or low.  Place commonly used objects within easy reach.  If necessary, use a sturdy step stool with a grab bar when reaching.  Keep electrical cables out of the way.  Do not use floor polish or wax that makes floors slippery. If you must use wax, use non-skid floor wax.  Remove throw rugs and tripping hazards from the floor. STAIRWAYS  Never leave objects on stairs.  Place handrails on both sides of stairways and use them. Fix any loose handrails. Make sure handrails on both sides of the stairways are as long as the stairs.  Check carpeting to make sure it is firmly attached along stairs. Make repairs to worn or loose carpet promptly.  Avoid placing throw rugs at the top or bottom of stairways, or properly secure the rug with carpet tape to prevent slippage. Get rid of throw rugs, if possible.  Have an electrician put in a light switch at the top and bottom of the stairs. OTHER FALL PREVENTION TIPS  Wear low-heel or rubber-soled shoes that are supportive and fit well. Wear closed toe shoes.  When using a stepladder, make sure it is fully opened and both spreaders are firmly locked. Do not climb a closed stepladder.  Add color or contrast paint or tape to grab bars and handrails  in your home. Place contrasting color strips on first and last steps.  Learn and use mobility aids as needed. Install an electrical emergency response system.  Turn on lights to avoid dark areas. Replace light bulbs that burn out immediately. Get light switches that glow.  Arrange furniture to create clear pathways. Keep furniture in the same place.  Firmly attach carpet with non-skid or double-sided tape.  Eliminate uneven floor surfaces.  Select a carpet pattern that does not visually hide the edge of steps.  Be  aware of all pets. OTHER HOME SAFETY TIPS  Set the water temperature for 120 F (48.8 C).  Keep emergency numbers on or near the telephone.  Keep smoke detectors on every level of the home and near sleeping areas. Document Released: 01/02/2002 Document Revised: 07/14/2011 Document Reviewed: 04/03/2011 [facility] Patient Information 2014 [place].     Humerus Fracture, Treated with Immobilization The humerus is the large bone in the upper arm. A broken (fractured) humerus is often treated by wearing a cast, splint, or sling (immobilization). This holds the broken pieces in place so they can heal.  HOME CARE  Put ice on the injured area.  Put ice in a plastic bag.  Place a towel between your skin and the bag.  Leave the ice on for 15-20 minutes, 03-04 times a day.  If you are given a cast:  Do not scratch the skin under the cast.  Check the skin around the cast every day. You may put lotion on any red or sore areas.  Keep the cast dry and clean.  If you are given a splint:  Wear the splint as told.  Keep the splint clean and dry.  Loosen the elastic around the splint if your fingers become numb, cold, tingle, or turn blue.  If you are given a sling:  Wear the sling as told.  Do not put pressure on any part of the cast or splint until it is fully hardened.  The cast or splint must be protected with a plastic bag during bathing. Do not lower the cast or splint into water.  Only take medicine as told by your doctor.  Do exercises as told by your doctor.  Follow up as told by your doctor. GET HELP RIGHT AWAY IF:   Your skin or fingernails turn blue or gray.  Your arm feels cold or numb.  You have very bad pain in the injured arm.  You are having problems with the medicines you were given. MAKE SURE YOU:   Understand these instructions.  Will watch your condition.  Will get help right away if you are not doing well or get worse. Document Released:  07/01/2007 Document Revised: 04/06/2011 Document Reviewed: 02/26/2010 [facility] Patient Information 2014 [place].

## 2013-02-01 NOTE — ED Notes (Signed)
Patient is alert and oriented X1

## 2013-02-01 NOTE — ED Provider Notes (Signed)
Pt was in ED awaiting SW and ECF placement.  SW indicates pt accepted to [facility], ready for d/c. Pt alert, content, and appears in no acute distress, vitals normal, stable for d/c to ECF.    Mirna Mires, MD 02/01/13 [phone]

## 2013-02-01 NOTE — ED Notes (Signed)
Condom cath placed to obtain UA.

## 2013-02-01 NOTE — ED Notes (Signed)
PT at bedside.

## 2013-02-01 NOTE — ED Notes (Signed)
Area on right butt cheek reddened, massaged and barrier cream applied.

## 2013-02-01 NOTE — ED Notes (Signed)
[name] Minus- [phone] [place] [phone] and [phone]

## 2013-02-03 ENCOUNTER — Encounter: Payer: Self-pay | Admitting: Internal Medicine

## 2013-02-03 ENCOUNTER — Non-Acute Institutional Stay (SKILLED_NURSING_FACILITY): Payer: Medicare Other | Admitting: Internal Medicine

## 2013-02-03 DIAGNOSIS — F0150 Vascular dementia without behavioral disturbance: Secondary | ICD-10-CM

## 2013-02-03 DIAGNOSIS — R269 Unspecified abnormalities of gait and mobility: Secondary | ICD-10-CM

## 2013-02-03 DIAGNOSIS — F01518 Vascular dementia, unspecified severity, with other behavioral disturbance: Secondary | ICD-10-CM | POA: Insufficient documentation

## 2013-02-03 DIAGNOSIS — F02818 Dementia in other diseases classified elsewhere, unspecified severity, with other behavioral disturbance: Secondary | ICD-10-CM

## 2013-02-03 DIAGNOSIS — G20 Parkinson's disease: Secondary | ICD-10-CM

## 2013-02-03 DIAGNOSIS — F0281 Dementia in other diseases classified elsewhere with behavioral disturbance: Secondary | ICD-10-CM

## 2013-02-03 DIAGNOSIS — R1319 Other dysphagia: Secondary | ICD-10-CM

## 2013-02-03 DIAGNOSIS — F411 Generalized anxiety disorder: Secondary | ICD-10-CM

## 2013-02-03 DIAGNOSIS — F0151 Vascular dementia with behavioral disturbance: Secondary | ICD-10-CM

## 2013-02-03 DIAGNOSIS — I10 Essential (primary) hypertension: Secondary | ICD-10-CM

## 2013-02-03 NOTE — Progress Notes (Signed)
Patient ID: [name] [name], male   DOB: 01-17-1928, 80 y.o.   MRN: [id number]     [name] [name] living [place]    PCP: Mathews Argyle, MD  Code Status: full code  Allergies  Allergen Reactions  . Aricept [Donepezil Hcl]   . Zetia [Ezetimibe]   . Lipitor [Atorvastatin]     Chief Complaint: new admit  HPI:  80 y/o male patient with dementia and parkinson's disease is here for STR after ED visit with frequent falls and unable to take care of himself. He recently had a fall and left humerus fracture. He has a splint in place. He is unable to provide hpi or ROS. He is alert and oriented only to person. He is in no distress. He has been working some with therapy. His tremor limits his mobility and has shuffling gait. He has been seen by SLP team here and is now on dysphagia diet. Needs cueing  ROS Unable to obtain from patient No concern from staff Requiring total care   Past Medical History  Diagnosis Date  . Dementia   . Parkinson's disease   . Benign positional vertigo   . Hypertension   . Depression   . Cerebrovascular disease   . Dyslipidemia   . Diverticulosis   . Bundle branch block, right   . Mild aortic insufficiency   . Migraine headache   . History of seizures     Alzheimer's seizures  . Prostate cancer   . Dementia   . Memory loss    Past Surgical History  Procedure Laterality Date  . Radium seed implants      Prostate cancer  . Cholecystectomy    . Appendectomy    . Tonsillectomy     Social History:   reports that he quit smoking about 45 years ago. He has never used smokeless tobacco. He reports that he does not drink alcohol or use illicit drugs.  Family History  Problem Relation Age of Onset  . Heart attack Father     Medications: Patient's Medications  New Prescriptions   No medications on file  Previous Medications   CARBIDOPA-LEVODOPA-ENTACAPONE (STALEVO) 25-100-200 MG PER TABLET    Take 1 tablet by mouth 3 (three) times daily.   FLUOXETINE (PROZAC) 20 MG CAPSULE    Take 20 mg by mouth daily.   HYDROCODONE-ACETAMINOPHEN (NORCO) 5-325 MG PER TABLET    Take 1 tablet by mouth every 6 (six) hours as needed.  Modified Medications   No medications on file  Discontinued Medications   AMLODIPINE (NORVASC) 2.5 MG TABLET    Take 2.5 mg by mouth at bedtime.     Physical Exam: Filed Vitals:   02/03/13 1028  BP: 119/50  Pulse: 77  Temp: 97.9 F (36.6 C)  Resp: 18  Height: 5\' 10"  (1.778 m)  Weight: 125 lb (56.7 kg)  SpO2: 98%   Constitutional: He appears frail, elderly male in no distress  HENT:   Head: Normocephalic.  Abrasion midline fore head  Eyes: Pupils are equal, round, and reactive to light.  Neck:  Normal ROM  Cardiovascular: Normal rate.   Pulmonary/Chest: Effort normal. CTAB Abdominal: Soft. He exhibits no distension. There is no tenderness.  Musculoskeletal:  Pill rolling tremor in right hand, left hand in sling, able to move digits Skin: Skin is warm and dry Neurological: follows few commands Psych: oriented to person only, normal mood   Labs reviewed: Basic Metabolic Panel:  Recent Labs  02/01/13 0830  NA 132*  K 3.6*  CL 97  CO2 22  GLUCOSE 131*  BUN 18  CREATININE 0.85  CALCIUM 8.7   Liver Function Tests:  Recent Labs  02/01/13 0830  AST 28  ALT 16  ALKPHOS 41  BILITOT 0.8  PROT 6.3  ALBUMIN 3.5   No results found for this basename: LIPASE, AMYLASE,  in the last 8760 hours No results found for this basename: AMMONIA,  in the last 8760 hours CBC:  Recent Labs  02/01/13 0830  WBC 13.1*  HGB 12.5*  HCT 34.7*  MCV 88.7  PLT 184    Radiological Exams:  02/01/13 PELVIS - 1-2 VIEW   COMPARISON:  None.   FINDINGS: There is no evidence of pelvic fracture or diastasis. No other pelvic bone lesions are seen. Prostate brachytherapy seeds.   IMPRESSION: No evidence of osseous injury.   01/31/13 Xray left shoulder-  IMPRESSION: Displaced fracture left humeral  neck without definite dislocation.   02/01/13 IMPRESSION: Known surgical neck left humerus fracture. Displacement and impaction is similar to previous. No new fracture.   02/01/13 CT head: No acute intracranial process.   Involutional changes. Mild to moderate white matter changes suggest chronic small vessel ischemic disease.   CT cervical spine: Straightened cervical lordosis without acute fracture deformity nor malalignment   Assessment/Plan  Unsteady gait To work with PT and OT for gait training. Fall precautions  Dysphagia Working with SLP team, continue dysphagia diet. Aspiration precautions  Fracture of left humerus Continue to wear the slig for now. norco prn for pain. Monitor for sedation. Will make appointment for orthopedic follow up  Anxiety Will have him on seroquel 12.5 mg at bedtime to help him calm down and monitor. Continue his prozac  Hypertension bp currently stable. Continue norvasc 2.5 mg daily  Parkinson's disease Continue sinemet tid, monitor gait, monitor behavior  Dementia In setting of vascular complication and parkisnon, continue sinemet. Bp stable on current med. Continue this. Frequent cueing. Skin care. Aspiration and fall precautions  Family/ staff Communication: reviewed care plan with patient and nursing supervisor   Goals of care: likely pt will be a long term care patient of the facility, to review goals of care with family member   Labs/tests ordered: cbc, cmp

## 2013-02-04 ENCOUNTER — Other Ambulatory Visit: Payer: Self-pay | Admitting: Neurology

## 2013-02-07 ENCOUNTER — Other Ambulatory Visit (HOSPITAL_COMMUNITY): Payer: Self-pay | Admitting: Geriatric Medicine

## 2013-02-07 DIAGNOSIS — R1310 Dysphagia, unspecified: Secondary | ICD-10-CM

## 2013-02-07 DIAGNOSIS — G20 Parkinson's disease: Secondary | ICD-10-CM

## 2013-02-09 ENCOUNTER — Ambulatory Visit (HOSPITAL_COMMUNITY)
Admission: RE | Admit: 2013-02-09 | Discharge: 2013-02-09 | Disposition: A | Discharge status: Home / Self Care | Payer: Medicare Other | Source: Ambulatory Visit | Attending: Geriatric Medicine | Admitting: Geriatric Medicine

## 2013-02-09 ENCOUNTER — Ambulatory Visit (HOSPITAL_COMMUNITY)
Admission: RE | Admit: 2013-02-09 | Discharge: 2013-02-09 | Disposition: A | Discharge status: Home / Self Care | Payer: Medicare Other | Source: Ambulatory Visit | Attending: Internal Medicine | Admitting: Internal Medicine

## 2013-02-09 DIAGNOSIS — I10 Essential (primary) hypertension: Secondary | ICD-10-CM | POA: Insufficient documentation

## 2013-02-09 DIAGNOSIS — F0280 Dementia in other diseases classified elsewhere without behavioral disturbance: Secondary | ICD-10-CM | POA: Insufficient documentation

## 2013-02-09 DIAGNOSIS — G3183 Dementia with Lewy bodies: Secondary | ICD-10-CM

## 2013-02-09 DIAGNOSIS — R1313 Dysphagia, pharyngeal phase: Secondary | ICD-10-CM | POA: Insufficient documentation

## 2013-02-09 DIAGNOSIS — G20 Parkinson's disease: Secondary | ICD-10-CM

## 2013-02-09 DIAGNOSIS — R1312 Dysphagia, oropharyngeal phase: Secondary | ICD-10-CM | POA: Insufficient documentation

## 2013-02-09 DIAGNOSIS — R1310 Dysphagia, unspecified: Secondary | ICD-10-CM

## 2013-02-09 DIAGNOSIS — Z8546 Personal history of malignant neoplasm of prostate: Secondary | ICD-10-CM | POA: Insufficient documentation

## 2013-02-09 DIAGNOSIS — T17408A Unspecified foreign body in trachea causing other injury, initial encounter: Secondary | ICD-10-CM | POA: Insufficient documentation

## 2013-02-09 DIAGNOSIS — IMO0002 Reserved for concepts with insufficient information to code with codable children: Secondary | ICD-10-CM | POA: Insufficient documentation

## 2013-02-09 DIAGNOSIS — G309 Alzheimer's disease, unspecified: Secondary | ICD-10-CM | POA: Insufficient documentation

## 2013-02-09 NOTE — Procedures (Signed)
Objective Swallowing Evaluation: Modified Barium Swallowing Study  Patient Details  Name: [name] [name] MRN: [id number] Date of Birth: [birth year]  Today's Date: 02/09/2013 Time: 1110-1140 SLP Time Calculation (min): 30 min  Past Medical History:  Past Medical History  Diagnosis Date  . Dementia   . Parkinson's disease   . Benign positional vertigo   . Hypertension   . Depression   . Cerebrovascular disease   . Dyslipidemia   . Diverticulosis   . Bundle branch block, right   . Mild aortic insufficiency   . Migraine headache   . History of seizures     Alzheimer's seizures  . Prostate cancer   . Dementia   . Memory loss    Past Surgical History:  Past Surgical History  Procedure Laterality Date  . Radium seed implants      Prostate cancer  . Cholecystectomy    . Appendectomy    . Tonsillectomy     HPI:  Pt is an 80 year old [place] resident, PMH including Dementia (Parkinsons per SLP). Pt recently evaluated by SLP with observable signs of aspiration, SNF SLP present for exam.      Assessment / Plan / Recommendation Clinical Impression  Dysphagia Diagnosis: Moderate pharyngeal phase dysphagia;Mild oral phase dysphagia Clinical impression: Pt presents with a mild to moderate oropharyngeal dysphagia with decreased lingual strength and manipulation for clearance of bolus during oral phase. Pharyngeal phase characterized by weakness throughout the hyolaryngeal mechanism resulting in decreased peristalsis, incomplete airway closure and moderate vallecualar and pyriform residuals remaining post swallow. With thin liquids there is typically trace penetration above and contacting cords with intially good, fading to intermittent sensation as study progressed. There were 2-3 instances of sensed aspiration with thin liquids, typically with increased bolus size or poor positioning (chin tuck and head tilt back lead to significant aspiration). With cues to clear throat forcefully  and swallow again pt was able to expel penetrate and remove most, but not all residue. No signficant functional difference between thin and nectar. Recommend dys 2 solids with pureed meats (which is possible at their facility per SLP) and thin liquids. Meds given in puree. Consider full supervision for throat clear and second swallow, brief meals with increased frequency to limit fatigue. Defer to SLP at [facility] for final decision regarding diet.     Treatment Recommendation  Defer treatment plan to SLP at (Comment)    Diet Recommendation Dysphagia 2 (Fine chop);Thin liquid   Liquid Administration via: Cup Medication Administration: Whole meds with puree Supervision: Patient able to self feed;Full supervision/cueing for compensatory strategies Compensations: Slow rate;Small sips/bites;Check for pocketing;Multiple dry swallows after each bite/sip;Clear throat intermittently;Effortful swallow Postural Changes and/or Swallow Maneuvers: Seated upright 90 degrees;Upright 30-60 min after meal    Other  Recommendations Oral Care Recommendations: Oral care before and after PO   Follow Up Recommendations  Skilled Nursing facility    Frequency and Duration        Pertinent Vitals/Pain NA    SLP Swallow Goals     General HPI: Pt is an 80 year old [place] resident, PMH including Dementia (Parkinsons per SLP). Pt recently evaluated by SLP with observable signs of aspiration, SNF SLP present for exam.  Type of Study: Modified Barium Swallowing Study Reason for Referral: Objectively evaluate swallowing function Diet Prior to this Study: Dysphagia 2 (chopped);Nectar-thick liquids (with pureed meats) Temperature Spikes Noted: No Respiratory Status: Room air History of Recent Intubation: No Behavior/Cognition: Alert;Cooperative;Pleasant mood;Requires cueing Oral Cavity -  Dentition: Dentures, top;Dentures, bottom Oral Motor / Sensory Function: Impaired motor (weak/tremulous) Self-Feeding  Abilities: Able to feed self Patient Positioning: Upright in chair Baseline Vocal Quality: Low vocal intensity Volitional Cough: Strong Volitional Swallow: Able to elicit Anatomy:  (possible small bony protrusion at C5/6) Pharyngeal Secretions: Not observed secondary MBS    Reason for Referral Objectively evaluate swallowing function   Oral Phase Oral Preparation/Oral Phase Oral Phase: Impaired Oral - Nectar Oral - Nectar Cup: Lingual pumping;Reduced posterior propulsion;Lingual/palatal residue Oral - Thin Oral - Thin Cup: Lingual pumping;Reduced posterior propulsion;Lingual/palatal residue Oral - Solids Oral - Puree: Weak lingual manipulation;Lingual pumping;Reduced posterior propulsion;Delayed oral transit;Lingual/palatal residue Oral - Mechanical Soft: Weak lingual manipulation;Lingual pumping;Reduced posterior propulsion;Delayed oral transit;Lingual/palatal residue Oral - Pill: Weak lingual manipulation;Lingual pumping;Reduced posterior propulsion;Delayed oral transit;Lingual/palatal residue   Pharyngeal Phase Pharyngeal Phase Pharyngeal Phase: Impaired Pharyngeal - Nectar Pharyngeal - Nectar Cup: Reduced pharyngeal peristalsis;Reduced epiglottic inversion;Reduced anterior laryngeal mobility;Reduced laryngeal elevation;Reduced airway/laryngeal closure;Reduced tongue base retraction;Penetration/Aspiration during swallow;Pharyngeal residue - valleculae;Pharyngeal residue - pyriform sinuses Penetration/Aspiration details (nectar cup): Material enters airway, remains ABOVE vocal cords and not ejected out;Material does not enter airway;Material enters airway, remains ABOVE vocal cords then ejected out Pharyngeal - Thin Pharyngeal - Thin Cup: Reduced pharyngeal peristalsis;Reduced epiglottic inversion;Reduced anterior laryngeal mobility;Reduced laryngeal elevation;Reduced airway/laryngeal closure;Reduced tongue base retraction;Penetration/Aspiration during swallow;Pharyngeal residue -  valleculae;Pharyngeal residue - pyriform sinuses;Penetration/Aspiration before swallow;Premature spillage to pyriform sinuses;Trace aspiration;Significant aspiration (Amount) Penetration/Aspiration details (thin cup): Material enters airway, passes BELOW cords then ejected out;Material enters airway, passes BELOW cords and not ejected out despite cough attempt by patient;Material enters airway, remains ABOVE vocal cords then ejected out;Material enters airway, CONTACTS cords then ejected out;Material enters airway, remains ABOVE vocal cords and not ejected out Pharyngeal - Solids Pharyngeal - Puree: Reduced pharyngeal peristalsis;Reduced epiglottic inversion;Reduced anterior laryngeal mobility;Reduced laryngeal elevation;Reduced airway/laryngeal closure;Reduced tongue base retraction;Pharyngeal residue - valleculae;Pharyngeal residue - pyriform sinuses Penetration/Aspiration details (puree): Material does not enter airway Pharyngeal - Mechanical Soft: Reduced pharyngeal peristalsis;Reduced epiglottic inversion;Reduced anterior laryngeal mobility;Reduced laryngeal elevation;Reduced airway/laryngeal closure;Reduced tongue base retraction;Pharyngeal residue - valleculae;Pharyngeal residue - pyriform sinuses Pharyngeal - Pill: Reduced pharyngeal peristalsis;Reduced epiglottic inversion;Reduced anterior laryngeal mobility;Reduced laryngeal elevation;Reduced airway/laryngeal closure;Reduced tongue base retraction;Pharyngeal residue - valleculae;Pharyngeal residue - pyriform sinuses  Cervical Esophageal Phase    GO    Cervical Esophageal Phase Cervical Esophageal Phase: Impaired Cervical Esophageal Phase - Comment Cervical Esophageal Comment: slightly decreased opening of UES from questionable bony protrusion at C5/6    Functional Assessment Tool Used: clinical judgement Functional Limitations: Swallowing Swallow Current Status ([id number]): At least 40 percent but less than 60 percent impaired, limited  or restricted Swallow Goal Status [phone]): At least 40 percent but less than 60 percent impaired, limited or restricted Swallow Discharge Status [phone]): At least 40 percent but less than 60 percent impaired, limited or restricted   [facility], MA CCC-SLP [phone]  Lynann Beaver 02/09/2013, 12:19 PM

## 2013-02-23 ENCOUNTER — Ambulatory Visit: Payer: Medicare Other | Admitting: Podiatry

## 2013-02-28 ENCOUNTER — Encounter (HOSPITAL_COMMUNITY): Payer: Self-pay | Admitting: *Deleted

## 2013-02-28 NOTE — Progress Notes (Signed)
Spoke with [name] [name], nurse for [name] [name] at [facility]. She verified allergies, meds and medical hx. She requested that I fax day of surgery information to [phone]. Faxed time of surgery, time of arrival, meds to take am of surgery, NPO after midnight and requested an updated MAR to be sent with patient. I also called and spoke with pt's wife, [name] [name] and she verified surgical hx, recent infection hx and was also given day of surgery information. She will be coming to be with patient, she will not arrive as early as patient. She is the power of attorney and will sign the consent form day of surgery. I did tell her that she needed to be here prior to surgery time to sign the consent form and she voiced understanding.

## 2013-02-28 NOTE — Pre-Procedure Instructions (Signed)
[name] [name]  02/28/2013   Your procedure is scheduled on:  Thursday, March 02, 2013 at 1:00 PM.   Report to [facility] Entrance "A" Admitting Office  at 10:30 AM.   Call this number if you have problems the morning of surgery: [phone]   Remember:   Do not eat food or drink liquids after midnight Wednesday, 03/01/13.  Take these medicines the morning of surgery with A SIP OF WATER: Prozac, Stalevo, Hydrocodone or Tylenol if needed for pain.   Do not wear jewelry.  Do not wear lotions, powders, or cologne. You may NOT wear deodorant.  Men may shave face and neck.  Do not bring valuables to the hospital.  [facility] is not responsible                  for any belongings or valuables.               Contacts, dentures or bridgework may not be worn into surgery.  Leave suitcase in the car. After surgery it may be brought to your room.  For patients admitted to the hospital, discharge time is determined by your                treatment team.              Special Instructions: Please send a copy of the most updated MAR with patient day of surgery.

## 2013-03-01 ENCOUNTER — Ambulatory Visit: Payer: Medicare Other | Admitting: Podiatry

## 2013-03-01 MED ORDER — CHLORHEXIDINE GLUCONATE CLOTH 2 % EX PADS: 6.0000 | MEDICATED_PAD | Freq: Once | CUTANEOUS | Status: DC

## 2013-03-01 MED ORDER — CEFAZOLIN SODIUM-DEXTROSE 2-3 GM-% IV SOLR
2.0000 g | INTRAVENOUS | Status: AC
  Administered 2013-03-02: 2 g via INTRAVENOUS
  Filled 2013-03-01: qty 50

## 2013-03-01 MED ORDER — CHLORHEXIDINE GLUCONATE 4 % EX LIQD: 60.0000 mL | Freq: Once | CUTANEOUS | Status: DC

## 2013-03-01 NOTE — H&P (Signed)
  [name] [name] is an 80 y.o. male.    Chief Complaint: left shoulder pain  HPI: Pt is a 80 y.o. male complaining of left shoulder pain for 1 month. Pain had continually increased since the beginning. X-rays in the clinic show left proximal humerus fracture with displacement. Various options are discussed with the patient. Risks, benefits and expectations were discussed with the patient. Patient understand the risks, benefits and expectations and wishes to proceed with surgery.   PCP:  Mathews Argyle, MD  D/C Plans:  SNF/Rehab  PMH: Past Medical History  Diagnosis Date  . Dementia   . Parkinson's disease   . Benign positional vertigo   . Hypertension   . Depression   . Cerebrovascular disease   . Dyslipidemia   . Diverticulosis   . Bundle branch block, right   . Mild aortic insufficiency   . History of seizures     Alzheimer's seizures  . Prostate cancer   . Dementia   . Memory loss   . Stroke   . Migraine headache     wife states he does not have migraines    PSH: Past Surgical History  Procedure Laterality Date  . Radium seed implants      Prostate cancer  . Cholecystectomy    . Appendectomy    . Tonsillectomy    . Cardiac catheterization      wife states had several years ago and that was clear.    Social History:  reports that he quit smoking about 45 years ago. He has never used smokeless tobacco. He reports that he drinks alcohol. He reports that he does not use illicit drugs.  Allergies:  Allergies  Allergen Reactions  . Aricept [Donepezil Hcl]   . Zetia [Ezetimibe]   . Lipitor [Atorvastatin]     Medications: No current facility-administered medications for this encounter.   Current Outpatient Prescriptions  Medication Sig Dispense Refill  . acetaminophen (TYLENOL) 325 MG tablet Take 325 mg by mouth every 6 (six) hours as needed.      . carbidopa-levodopa-entacapone (STALEVO) 25-100-200 MG per tablet TAKE 1 TABLET BY MOUTH THREE TIMES DAILY   90 tablet  1  . FLUoxetine (PROZAC) 20 MG capsule Take 20 mg by mouth daily.      Marland Kitchen HYDROcodone-acetaminophen (NORCO) 5-325 MG per tablet Take 1 tablet by mouth every 6 (six) hours as needed.  20 tablet  0    No results found for this or any previous visit (from the past 48 hour(s)). No results found.  ROS: Pain with rom of the left upper extremity  Physical Exam:  Alert but disoriented 80 y.o. male in no acute distress Cranial nerves 2-12 intact Cervical spine: full rom with no tenderness, nv intact distally Chest: active breath sounds bilaterally, no wheeze rhonchi or rales Heart: regular rate and rhythm, no murmur Abd: non tender non distended with active bowel sounds Hip is stable with rom  Left upper extremity with moderate pain with rom nv intact distally Mild edema No signs of infection or open wound Limited rom due to known fracture  Assessment/Plan Assessment: left proximal humerus fracture   Plan: Patient will undergo a left shoulder hemi arthroplasty by Dr. Veverly Fells at [facility]. Risks benefits and expectations were discussed with the patient. Patient understand risks, benefits and expectations and wishes to proceed.

## 2013-03-02 ENCOUNTER — Inpatient Hospital Stay (HOSPITAL_COMMUNITY): Payer: Medicare Other

## 2013-03-02 ENCOUNTER — Inpatient Hospital Stay (HOSPITAL_COMMUNITY)
Admission: RE | Admit: 2013-03-02 | Discharge: 2013-03-04 | DRG: 483 | Disposition: A | Discharge status: Skilled Nursing Facility | Payer: Medicare Other | Source: Ambulatory Visit | Attending: Orthopedic Surgery | Admitting: Orthopedic Surgery

## 2013-03-02 ENCOUNTER — Encounter (HOSPITAL_COMMUNITY): Payer: Self-pay | Admitting: Certified Registered Nurse Anesthetist

## 2013-03-02 ENCOUNTER — Encounter (HOSPITAL_COMMUNITY): Payer: Medicare Other | Admitting: Certified Registered Nurse Anesthetist

## 2013-03-02 ENCOUNTER — Inpatient Hospital Stay (HOSPITAL_COMMUNITY): Payer: Medicare Other | Admitting: Certified Registered Nurse Anesthetist

## 2013-03-02 ENCOUNTER — Encounter (HOSPITAL_COMMUNITY)
Admission: RE | Disposition: A | Discharge status: Skilled Nursing Facility | Payer: Medicare Other | Source: Ambulatory Visit | Attending: Orthopedic Surgery

## 2013-03-02 DIAGNOSIS — Z79899 Other long term (current) drug therapy: Secondary | ICD-10-CM

## 2013-03-02 DIAGNOSIS — Z9089 Acquired absence of other organs: Secondary | ICD-10-CM

## 2013-03-02 DIAGNOSIS — F0280 Dementia in other diseases classified elsewhere without behavioral disturbance: Secondary | ICD-10-CM | POA: Diagnosis present

## 2013-03-02 DIAGNOSIS — Z8546 Personal history of malignant neoplasm of prostate: Secondary | ICD-10-CM

## 2013-03-02 DIAGNOSIS — E785 Hyperlipidemia, unspecified: Secondary | ICD-10-CM | POA: Diagnosis present

## 2013-03-02 DIAGNOSIS — Z9181 History of falling: Secondary | ICD-10-CM

## 2013-03-02 DIAGNOSIS — F3289 Other specified depressive episodes: Secondary | ICD-10-CM | POA: Diagnosis present

## 2013-03-02 DIAGNOSIS — S42202P Unspecified fracture of upper end of left humerus, subsequent encounter for fracture with malunion: Secondary | ICD-10-CM

## 2013-03-02 DIAGNOSIS — Z8673 Personal history of transient ischemic attack (TIA), and cerebral infarction without residual deficits: Secondary | ICD-10-CM

## 2013-03-02 DIAGNOSIS — F329 Major depressive disorder, single episode, unspecified: Secondary | ICD-10-CM | POA: Diagnosis present

## 2013-03-02 DIAGNOSIS — F0390 Unspecified dementia without behavioral disturbance: Secondary | ICD-10-CM | POA: Diagnosis present

## 2013-03-02 DIAGNOSIS — IMO0002 Reserved for concepts with insufficient information to code with codable children: Principal | ICD-10-CM | POA: Diagnosis present

## 2013-03-02 DIAGNOSIS — Z87891 Personal history of nicotine dependence: Secondary | ICD-10-CM

## 2013-03-02 DIAGNOSIS — G3183 Dementia with Lewy bodies: Secondary | ICD-10-CM

## 2013-03-02 DIAGNOSIS — K5730 Diverticulosis of large intestine without perforation or abscess without bleeding: Secondary | ICD-10-CM | POA: Diagnosis present

## 2013-03-02 DIAGNOSIS — I10 Essential (primary) hypertension: Secondary | ICD-10-CM | POA: Diagnosis present

## 2013-03-02 DIAGNOSIS — I679 Cerebrovascular disease, unspecified: Secondary | ICD-10-CM | POA: Diagnosis present

## 2013-03-02 HISTORY — DX: Unspecified fracture of upper end of left humerus, subsequent encounter for fracture with malunion: S42.202P

## 2013-03-02 HISTORY — DX: Cerebral infarction, unspecified: I63.9

## 2013-03-02 HISTORY — PX: SHOULDER HEMI-ARTHROPLASTY: SHX5049

## 2013-03-02 LAB — BASIC METABOLIC PANEL
BUN: 14 mg/dL (ref 6–23)
CALCIUM: 9 mg/dL (ref 8.4–10.5)
CO2: 24 mEq/L (ref 19–32)
Chloride: 99 mEq/L (ref 96–112)
Creatinine, Ser: 0.77 mg/dL (ref 0.50–1.35)
GFR calc Af Amer: >90 mL/min (ref >90)
GFR calc non Af Amer: 82 mL/min — ABNORMAL LOW (ref >90)
Glucose, Bld: 90 mg/dL (ref 70–99)
Potassium: 4.4 mEq/L (ref 3.7–5.3)
Sodium: 137 mEq/L (ref 137–147)

## 2013-03-02 LAB — CBC
HCT: 35.4 % — ABNORMAL LOW (ref 39.0–52.0)
Hemoglobin: 12 g/dL — ABNORMAL LOW (ref 13.0–17.0)
MCH: 31.7 pg (ref 26.0–34.0)
MCHC: 33.9 g/dL (ref 30.0–36.0)
MCV: 93.4 fL (ref 78.0–100.0)
PLATELETS: 417 10*3/uL — AB (ref 150–400)
RBC: 3.79 MIL/uL — ABNORMAL LOW (ref 4.22–5.81)
RDW: 12.9 % (ref 11.5–15.5)
WBC: 9.8 10*3/uL (ref 4.0–10.5)

## 2013-03-02 SURGERY — HEMIARTHROPLASTY, SHOULDER
Anesthesia: Regional | Site: Shoulder | Laterality: Left

## 2013-03-02 MED ORDER — MORPHINE SULFATE 2 MG/ML IJ SOLN: 1.0000 mg | INTRAMUSCULAR | Status: DC | PRN

## 2013-03-02 MED ORDER — HYDROCODONE-ACETAMINOPHEN 5-325 MG PO TABS
1.0000 | ORAL_TABLET | Freq: Four times a day (QID) | ORAL | Status: DC | PRN
  Administered 2013-03-03 – 2013-03-04 (×3): 1 via ORAL
  Filled 2013-03-02 (×3): qty 1

## 2013-03-02 MED ORDER — PROPOFOL 10 MG/ML IV BOLUS
INTRAVENOUS | Status: DC | PRN
  Administered 2013-03-02: 140 mg via INTRAVENOUS

## 2013-03-02 MED ORDER — ONDANSETRON HCL 4 MG/2ML IJ SOLN
INTRAMUSCULAR | Status: AC
  Filled 2013-03-02: qty 2

## 2013-03-02 MED ORDER — ALBUMIN HUMAN 5 % IV SOLN
INTRAVENOUS | Status: DC | PRN
  Administered 2013-03-02: 14:00:00 via INTRAVENOUS

## 2013-03-02 MED ORDER — BUPIVACAINE-EPINEPHRINE PF 0.25-1:200000 % IJ SOLN
INTRAMUSCULAR | Status: DC | PRN
  Administered 2013-03-02: 7 mL

## 2013-03-02 MED ORDER — ROCURONIUM BROMIDE 50 MG/5ML IV SOLN
INTRAVENOUS | Status: AC
  Filled 2013-03-02: qty 1

## 2013-03-02 MED ORDER — FLUOXETINE HCL 20 MG PO CAPS
20.0000 mg | ORAL_CAPSULE | Freq: Every day | ORAL | Status: DC
  Administered 2013-03-03 – 2013-03-04 (×2): 20 mg via ORAL
  Filled 2013-03-02 (×2): qty 1

## 2013-03-02 MED ORDER — BUPIVACAINE-EPINEPHRINE (PF) 0.25% -1:200000 IJ SOLN
INTRAMUSCULAR | Status: AC
  Filled 2013-03-02: qty 30

## 2013-03-02 MED ORDER — ROCURONIUM BROMIDE 100 MG/10ML IV SOLN
INTRAVENOUS | Status: DC | PRN
  Administered 2013-03-02: 30 mg via INTRAVENOUS

## 2013-03-02 MED ORDER — SODIUM CHLORIDE 0.9 % IR SOLN
Status: DC | PRN
  Administered 2013-03-02: 1000 mL

## 2013-03-02 MED ORDER — LIDOCAINE HCL (CARDIAC) 20 MG/ML IV SOLN
INTRAVENOUS | Status: AC
  Filled 2013-03-02: qty 5

## 2013-03-02 MED ORDER — MIDAZOLAM HCL 2 MG/2ML IJ SOLN
INTRAMUSCULAR | Status: AC
  Filled 2013-03-02: qty 2

## 2013-03-02 MED ORDER — ENTACAPONE 200 MG PO TABS
200.0000 mg | ORAL_TABLET | Freq: Three times a day (TID) | ORAL | Status: DC
  Administered 2013-03-02 – 2013-03-04 (×5): 200 mg via ORAL
  Filled 2013-03-02 (×7): qty 1

## 2013-03-02 MED ORDER — CARBIDOPA-LEVODOPA-ENTACAPONE 25-100-200 MG PO TABS: 1.0000 | ORAL_TABLET | Freq: Three times a day (TID) | ORAL | Status: DC

## 2013-03-02 MED ORDER — ROPIVACAINE HCL 5 MG/ML IJ SOLN
INTRAMUSCULAR | Status: DC | PRN
  Administered 2013-03-02: 20 mL via PERINEURAL

## 2013-03-02 MED ORDER — ACETAMINOPHEN 325 MG PO TABS
650.0000 mg | ORAL_TABLET | Freq: Four times a day (QID) | ORAL | Status: DC | PRN
  Administered 2013-03-03 (×3): 650 mg via ORAL
  Filled 2013-03-02 (×3): qty 2

## 2013-03-02 MED ORDER — MENTHOL 3 MG MT LOZG: 1.0000 | LOZENGE | OROMUCOSAL | Status: DC | PRN

## 2013-03-02 MED ORDER — ACETAMINOPHEN 650 MG RE SUPP
650.0000 mg | Freq: Four times a day (QID) | RECTAL | Status: DC | PRN
Start: 2013-03-02 — End: 2013-03-04

## 2013-03-02 MED ORDER — SODIUM CHLORIDE 0.9 % IV SOLN
INTRAVENOUS | Status: DC
  Administered 2013-03-02: 19:00:00 via INTRAVENOUS

## 2013-03-02 MED ORDER — PROPOFOL 10 MG/ML IV BOLUS
INTRAVENOUS | Status: AC
Start: 2013-03-02 — End: 2013-03-02
  Filled 2013-03-02: qty 20

## 2013-03-02 MED ORDER — LACTATED RINGERS IV SOLN
INTRAVENOUS | Status: DC
  Administered 2013-03-02: 12:00:00 via INTRAVENOUS

## 2013-03-02 MED ORDER — ONDANSETRON HCL 4 MG PO TABS: 4.0000 mg | ORAL_TABLET | Freq: Four times a day (QID) | ORAL | Status: DC | PRN

## 2013-03-02 MED ORDER — PHENOL 1.4 % MT LIQD: 1.0000 | OROMUCOSAL | Status: DC | PRN

## 2013-03-02 MED ORDER — FENTANYL CITRATE 0.05 MG/ML IJ SOLN
INTRAMUSCULAR | Status: AC
  Filled 2013-03-02: qty 5

## 2013-03-02 MED ORDER — ACETAMINOPHEN 325 MG PO TABS: 325.0000 mg | ORAL_TABLET | Freq: Four times a day (QID) | ORAL | Status: DC | PRN

## 2013-03-02 MED ORDER — ONDANSETRON HCL 4 MG/2ML IJ SOLN: 4.0000 mg | Freq: Four times a day (QID) | INTRAMUSCULAR | Status: DC | PRN

## 2013-03-02 MED ORDER — METOCLOPRAMIDE HCL 5 MG/ML IJ SOLN
5.0000 mg | Freq: Three times a day (TID) | INTRAMUSCULAR | Status: DC | PRN
Start: 2013-03-02 — End: 2013-03-04

## 2013-03-02 MED ORDER — ONDANSETRON HCL 4 MG/2ML IJ SOLN
INTRAMUSCULAR | Status: DC | PRN
  Administered 2013-03-02: 4 mg via INTRAVENOUS

## 2013-03-02 MED ORDER — LIDOCAINE HCL (CARDIAC) 20 MG/ML IV SOLN
INTRAVENOUS | Status: DC | PRN
  Administered 2013-03-02: 70 mg via INTRAVENOUS

## 2013-03-02 MED ORDER — 0.9 % SODIUM CHLORIDE (POUR BTL) OPTIME
TOPICAL | Status: DC | PRN
  Administered 2013-03-02: 1000 mL

## 2013-03-02 MED ORDER — METOCLOPRAMIDE HCL 10 MG PO TABS: 5.0000 mg | ORAL_TABLET | Freq: Three times a day (TID) | ORAL | Status: DC | PRN

## 2013-03-02 MED ORDER — DEXTROSE 5 % IV SOLN
10.0000 mg | INTRAVENOUS | Status: DC | PRN
  Administered 2013-03-02: 30 ug/min via INTRAVENOUS

## 2013-03-02 MED ORDER — CEFAZOLIN SODIUM-DEXTROSE 2-3 GM-% IV SOLR
2.0000 g | Freq: Four times a day (QID) | INTRAVENOUS | Status: AC
  Administered 2013-03-02 – 2013-03-03 (×3): 2 g via INTRAVENOUS
  Filled 2013-03-02 (×4): qty 50

## 2013-03-02 MED ORDER — LACTATED RINGERS IV SOLN
INTRAVENOUS | Status: DC | PRN
  Administered 2013-03-02 (×2): via INTRAVENOUS

## 2013-03-02 MED ORDER — CARBIDOPA-LEVODOPA 25-100 MG PO TABS
1.0000 | ORAL_TABLET | Freq: Three times a day (TID) | ORAL | Status: DC
  Administered 2013-03-02 – 2013-03-04 (×5): 1 via ORAL
  Filled 2013-03-02 (×7): qty 1

## 2013-03-02 SURGICAL SUPPLY — 57 items
BLADE SAW SAG 73X25 THK (BLADE) ×2
BLADE SAW SGTL 73X25 THK (BLADE) ×1 IMPLANT
BOWL SMART MIX CTS (DISPOSABLE) ×3 IMPLANT
CEMENT BONE DEPUY (Cement) ×3 IMPLANT
CLOSURE WOUND 1/2 X4 (GAUZE/BANDAGES/DRESSINGS) ×1
CLOTH BEACON ORANGE TIMEOUT ST (SAFETY) ×3 IMPLANT
COVER SURGICAL LIGHT HANDLE (MISCELLANEOUS) ×3 IMPLANT
DRAPE INCISE IOBAN 66X45 STRL (DRAPES) ×6 IMPLANT
DRAPE U-SHAPE 47X51 STRL (DRAPES) ×3 IMPLANT
DRILL BIT 5/64 (BIT) ×3 IMPLANT
DRSG ADAPTIC 3X8 NADH LF (GAUZE/BANDAGES/DRESSINGS) ×3 IMPLANT
DRSG PAD ABDOMINAL 8X10 ST (GAUZE/BANDAGES/DRESSINGS) ×6 IMPLANT
DURAPREP 26ML APPLICATOR (WOUND CARE) ×3 IMPLANT
ELECT NDL TIP 2.8 STRL (NEEDLE) ×1 IMPLANT
ELECT NEEDLE TIP 2.8 STRL (NEEDLE) ×3 IMPLANT
ELECT REM PT RETURN 9FT ADLT (ELECTROSURGICAL) ×3
ELECTRODE REM PT RTRN 9FT ADLT (ELECTROSURGICAL) ×1 IMPLANT
EPIPHYSIS BODY POROCOAT SZ10 (Orthopedic Implant) ×2 IMPLANT
EPIPHYSIS BODY POROCOAT SZ12-5 (Shoulder) ×2 IMPLANT
GLOVE BIOGEL PI ORTHO PRO 7.5 (GLOVE) ×2
GLOVE BIOGEL PI ORTHO PRO SZ8 (GLOVE) ×2
GLOVE ORTHO TXT STRL SZ7.5 (GLOVE) ×3 IMPLANT
GLOVE PI ORTHO PRO STRL 7.5 (GLOVE) ×1 IMPLANT
GLOVE PI ORTHO PRO STRL SZ8 (GLOVE) ×1 IMPLANT
GLOVE SURG ORTHO 8.5 STRL (GLOVE) ×3 IMPLANT
GOWN STRL REIN XL XLG (GOWN DISPOSABLE) ×6 IMPLANT
KIT BASIN OR (CUSTOM PROCEDURE TRAY) ×3 IMPLANT
KIT ROOM TURNOVER OR (KITS) ×3 IMPLANT
MANIFOLD NEPTUNE II (INSTRUMENTS) ×3 IMPLANT
NDL HYPO 25GX1X1/2 BEV (NEEDLE) ×1 IMPLANT
NDL SUT .5 MAYO 1.404X.05X (NEEDLE) ×1 IMPLANT
NEEDLE HYPO 25GX1X1/2 BEV (NEEDLE) ×6 IMPLANT
NEEDLE MAYO TAPER (NEEDLE) ×3
NS IRRIG 1000ML POUR BTL (IV SOLUTION) ×3 IMPLANT
PACK SHOULDER (CUSTOM PROCEDURE TRAY) ×3 IMPLANT
PAD ABD 8X10 STRL (GAUZE/BANDAGES/DRESSINGS) ×4 IMPLANT
PAD ARMBOARD 7.5X6 YLW CONV (MISCELLANEOUS) ×6 IMPLANT
SLING ARM IMMOBILIZER LRG (SOFTGOODS) ×3 IMPLANT
SLING ARM IMMOBILIZER MED (SOFTGOODS) IMPLANT
SPONGE GAUZE 4X4 12PLY (GAUZE/BANDAGES/DRESSINGS) ×5 IMPLANT
SPONGE LAP 18X18 X RAY DECT (DISPOSABLE) ×3 IMPLANT
STAPLER VISISTAT 35W (STAPLE) ×2 IMPLANT
STEM UNITE SZ12 (Stem) ×2 IMPLANT
STRIP CLOSURE SKIN 1/2X4 (GAUZE/BANDAGES/DRESSINGS) ×2 IMPLANT
SUCTION FRAZIER TIP 10 FR DISP (SUCTIONS) ×3 IMPLANT
SUT FIBERWIRE #2 38 T-5 BLUE (SUTURE) ×27
SUT MNCRL AB 4-0 PS2 18 (SUTURE) ×5 IMPLANT
SUT VIC AB 0 CT1 27 (SUTURE) ×6
SUT VIC AB 0 CT1 27XBRD ANBCTR (SUTURE) ×1 IMPLANT
SUT VIC AB 2-0 CT1 27 (SUTURE) ×6
SUT VIC AB 2-0 CT1 TAPERPNT 27 (SUTURE) ×1 IMPLANT
SUTURE FIBERWR #2 38 T-5 BLUE (SUTURE) ×2 IMPLANT
SYR CONTROL 10ML LL (SYRINGE) ×5 IMPLANT
TOWEL OR 17X24 6PK STRL BLUE (TOWEL DISPOSABLE) ×3 IMPLANT
TOWEL OR 17X26 10 PK STRL BLUE (TOWEL DISPOSABLE) ×3 IMPLANT
TRAY FOLEY CATH 16FRSI W/METER (SET/KITS/TRAYS/PACK) IMPLANT
WATER STERILE IRR 1000ML POUR (IV SOLUTION) ×3 IMPLANT

## 2013-03-02 NOTE — Brief Op Note (Signed)
03/02/2013  3:36 PM  PATIENT:  [place]  80 y.o. male  PRE-OPERATIVE DIAGNOSIS:  left proximal humerus fracture, displaced, subacute  POST-OPERATIVE DIAGNOSIS:  left proximal humerus fracture, displaced, subacute  PROCEDURE:  Procedure(s): LEFT SHOULDER HEMI-ARTHROPLASTY (Left) [employer] 48x18 head, 12 stem  SURGEON:  Surgeon(s) and Role:    * Augustin Schooling, MD - Primary  PHYSICIAN ASSISTANT:   ASSISTANTS: Ventura Bruns, PA-C   ANESTHESIA:   regional and general  EBL:  Total I/O In: 1250 [I.V.:1000; IV Piggyback:250] Out: -   BLOOD ADMINISTERED:none  DRAINS: none   LOCAL MEDICATIONS USED:  MARCAINE     SPECIMEN:  No Specimen  DISPOSITION OF SPECIMEN:  N/A  COUNTS:  YES  TOURNIQUET:  * No tourniquets in log *  DICTATION: .Other Dictation: Dictation Number [id number]  PLAN OF CARE: Admit for overnight observation  PATIENT DISPOSITION:  PACU - hemodynamically stable.   Delay start of Pharmacological VTE agent (>24hrs) due to surgical blood loss or risk of bleeding: yes

## 2013-03-02 NOTE — Anesthesia Procedure Notes (Addendum)
Anesthesia Regional Block:  Interscalene brachial plexus block  Pre-Anesthetic Checklist: ,, timeout performed, Correct Patient, Correct Site, Correct Laterality, Correct Procedure, Correct Position, site marked, Risks and benefits discussed,  Surgical consent,  Pre-op evaluation,  At surgeon's request and post-op pain management  Laterality: Left  Prep: chloraprep       Needles:  Injection technique: Single-shot  Needle Type: Echogenic Stimulator Needle          Additional Needles:  Procedures: ultrasound guided (picture in chart) Interscalene brachial plexus block Narrative:  Start time: 03/02/2013 1:29 PM End time: 03/02/2013 1:36 PM Injection made incrementally with aspirations every 5 mL.  Performed by: Personally  Anesthesiologist: Moser   Procedure Name: Intubation Date/Time: 03/02/2013 1:26 PM Performed by: Jacob Moores Pre-anesthesia Checklist: Patient identified, Emergency Drugs available, Suction available and Patient being monitored Patient Re-evaluated:Patient Re-evaluated prior to inductionOxygen Delivery Method: Circle system utilized Preoxygenation: Pre-oxygenation with 100% oxygen Intubation Type: IV induction Ventilation: Mask ventilation without difficulty Laryngoscope Size: Miller and 2 Grade View: Grade I Tube type: Oral Tube size: 7.5 mm Number of attempts: 1 Airway Equipment and Method: Stylet Placement Confirmation: ETT inserted through vocal cords under direct vision,  positive ETCO2 and breath sounds checked- equal and bilateral Secured at: 23 cm Tube secured with: Tape Dental Injury: Teeth and Oropharynx as per pre-operative assessment

## 2013-03-02 NOTE — Discharge Instructions (Signed)
DO NOT WEIGHT BEAR WITH THE LEFT SHOULDER   Ok to use the shoulder for ADLs  Keep incision clean and dry or one week then ok to shower.  Follow up with Dr Veverly Fells in two weeks [phone]

## 2013-03-02 NOTE — Preoperative (Signed)
Beta Blockers   Reason not to administer Beta Blockers:Not Applicable 

## 2013-03-02 NOTE — Progress Notes (Signed)
Utilization review completed.  

## 2013-03-02 NOTE — Anesthesia Preprocedure Evaluation (Addendum)
Anesthesia Evaluation  Patient identified by MRN, date of birth, ID band Patient awake    Reviewed: Allergy & Precautions, H&P , NPO status , Patient's Chart, lab work & pertinent test results  Airway Mallampati: II TM Distance: >3 FB Neck ROM: Full    Dental  (+) Dental Advisory Given, Upper Dentures, Partial Lower and Poor Dentition   Pulmonary former smoker,          Cardiovascular hypertension, + dysrhythmias     Neuro/Psych  Headaches, Seizures -,  PSYCHIATRIC DISORDERS Depression CVA    GI/Hepatic   Endo/Other    Renal/GU      Musculoskeletal   Abdominal   Peds  Hematology   Anesthesia Other Findings   Reproductive/Obstetrics                          Anesthesia Physical Anesthesia Plan  ASA: III  Anesthesia Plan: General and Regional   Post-op Pain Management:    Induction: Intravenous  Airway Management Planned: Oral ETT  Additional Equipment:   Intra-op Plan:   Post-operative Plan: Extubation in OR  Informed Consent: I have reviewed the patients History and Physical, chart, labs and discussed the procedure including the risks, benefits and alternatives for the proposed anesthesia with the patient or authorized representative who has indicated his/her understanding and acceptance.   Dental advisory given  Plan Discussed with: CRNA, Anesthesiologist and Surgeon  Anesthesia Plan Comments:         Anesthesia Quick Evaluation

## 2013-03-02 NOTE — Transfer of Care (Signed)
Immediate Anesthesia Transfer of Care Note  Patient: [name] [name]  Procedure(s) Performed: Procedure(s): LEFT SHOULDER HEMI-ARTHROPLASTY (Left)  Patient Location: PACU  Anesthesia Type:General  Level of Consciousness: lethargic and responds to stimulation  Airway & Oxygen Therapy: Patient Spontanous Breathing and Patient connected to nasal cannula oxygen  Post-op Assessment: Report given to PACU RN  Post vital signs: Reviewed and stable  Complications: No apparent anesthesia complications

## 2013-03-02 NOTE — Interval H&P Note (Signed)
History and Physical Interval Note:  03/02/2013 1:09 PM  [name] [name]  has presented today for surgery, with the diagnosis of left proximal humerous fracture  The various methods of treatment have been discussed with the patient and family. After consideration of risks, benefits and other options for treatment, the patient has consented to  Procedure(s): LEFT SHOULDER HEMI-ARTHROPLASTY (Left) as a surgical intervention .  The patient's history has been reviewed, patient examined, no change in status, stable for surgery.  I have reviewed the patient's chart and labs.  Questions were answered to the patient's satisfaction.     Kelci Petrella,STEVEN R

## 2013-03-02 NOTE — Anesthesia Postprocedure Evaluation (Signed)
Anesthesia Post Note  Patient: [name] [name]  Procedure(s) Performed: Procedure(s) (LRB): LEFT SHOULDER HEMI-ARTHROPLASTY (Left)  Anesthesia type: General  Patient location: PACU  Post pain: Pain level controlled  Post assessment: Patient's Cardiovascular Status Stable  Last Vitals:  Filed Vitals:   03/02/13 1630  BP: 142/72  Pulse: 88  Temp:   Resp: 33    Post vital signs: Reviewed and stable  Level of consciousness: alert  Complications: No apparent anesthesia complications

## 2013-03-02 NOTE — Progress Notes (Signed)
Dr. Ermalene Postin stated that patient did not need a type and screen.

## 2013-03-03 ENCOUNTER — Encounter: Payer: Self-pay | Admitting: Internal Medicine

## 2013-03-03 LAB — HEMOGLOBIN AND HEMATOCRIT, BLOOD
HCT: 27.6 % — ABNORMAL LOW (ref 39.0–52.0)
Hemoglobin: 9.4 g/dL — ABNORMAL LOW (ref 13.0–17.0)

## 2013-03-03 LAB — BASIC METABOLIC PANEL
BUN: 14 mg/dL (ref 6–23)
CO2: 20 mEq/L (ref 19–32)
Calcium: 7.9 mg/dL — ABNORMAL LOW (ref 8.4–10.5)
Chloride: 98 mEq/L (ref 96–112)
Creatinine, Ser: 0.74 mg/dL (ref 0.50–1.35)
GFR calc Af Amer: >90 mL/min (ref >90)
GFR calc non Af Amer: 83 mL/min — ABNORMAL LOW (ref >90)
GLUCOSE: 105 mg/dL — AB (ref 70–99)
Potassium: 3.8 mEq/L (ref 3.7–5.3)
Sodium: 133 mEq/L — ABNORMAL LOW (ref 137–147)

## 2013-03-03 NOTE — Progress Notes (Signed)
   Subjective: 1 Day Post-Op Procedure(s) (LRB): LEFT SHOULDER HEMI-ARTHROPLASTY (Left)  Pt c/o mild pain to left shoulder today but otherwise doing okay Denies any numbness or tingling distally Patient reports pain as mild.  Objective:   VITALS:   Filed Vitals:   03/03/13 0525  BP: 121/79  Pulse: 104  Temp: 97.4 F (36.3 C)  Resp: 20    Left shoulder dressing intact with no signs of drainage No erythema Remains in a sling nv intact distally  LABS  Recent Labs  03/02/13 1106 03/03/13 0345  HGB 12.0* 9.4*  HCT 35.4* 27.6*  WBC 9.8  --   PLT 417*  --      Recent Labs  03/02/13 1106 03/03/13 0345  NA 137 133*  K 4.4 3.8  BUN 14 14  CREATININE 0.77 0.74  GLUCOSE 90 105*     Assessment/Plan: 1 Day Post-Op Procedure(s) (LRB): LEFT SHOULDER HEMI-ARTHROPLASTY (Left) Will plan for d/c back to SNF on Monday PT/OT as able Pain control Pulmonary toilet   Brad Eleri Ruben, MPAS, PA-C  03/03/2013, 10:11 AM

## 2013-03-03 NOTE — Op Note (Signed)
[name] [name], [name] [name]              ACCOUNT NO.:  [id number]  MEDICAL RECORD NO.:  [id number]  LOCATION:  5N04C                        FACILITY:  [facility]  PHYSICIAN:  Doran Heater. Veverly Fells, M.D. DATE OF BIRTH:  02/03/1927  DATE OF PROCEDURE:  03/02/2013 DATE OF DISCHARGE:                              OPERATIVE REPORT   PREOPERATIVE DIAGNOSIS:  Left displaced subacute proximal humerus fracture.  POSTOPERATIVE DIAGNOSIS:  Left displaced subacute proximal humerus fracture.  PROCEDURE PERFORMED:  Left shoulder hemiarthroplasty using DePuy [employer] Unite system with 48 x 18 head and 12 stem.  ATTENDING SURGEON:  Doran Heater. Veverly Fells, M.D.  ASSISTANT:  Abbott Pao. Dixon, PA-C, who scrubbed during the entirety of procedure and necessary for satisfactory completion of surgery.  ANESTHESIA:  General anesthesia was used plus interscalene block.  ESTIMATED BLOOD LOSS:  Minimal.  FLUID REPLACEMENT:  1200 mL crystalloids.  INSTRUMENT COUNTS:  Correct.  COMPLICATIONS:  There were no complications.  ANTIBIOTICS:  Perioperative antibiotics were given.  INDICATIONS:  The patient is an 80 year old male with left shoulder injury after multiple falls.  The patient was initially brought to [place] after the second fall with disabling shoulder pain.  X-rays were obtained demonstrating what appeared to be a subacute proximal humerus fracture with severe malalignment.  The humeral shaft was superiorly migrated up under the acromion and the patient's humeral head was tilted significantly over to where it was basically 90 degrees to the long axis of the humerus and it appeared that there had been some relative motion between this fractured ends as there was some sclerosis on the under surface of the humeral head.  We discussed this with the patient's family initially, elected to proceed with conservative management and not elected to do any surgery despite the patient's pain and his inability to use his arm.  The  patient does have Parkinson's and some dementia; however, we recalled back the week after, the patient was just in incredible pain and not able to do anything with that arm and he normally uses that quite a bit.  The family asked if we could consider surgery and we certainly felt like it could be still done safely at this time.  Recommendation being for shoulder hemiarthroplasty.  Risks and benefits were discussed with the power of attorney and informed consent was obtained.  DESCRIPTION OF PROCEDURE:  After adequate level of  anesthesia achieved, the patient was positioned in a modified beach-chair position.  Left shoulder correctly identified, sterilely prepped and draped in usual manner.  Time-out called.  We started at the coracoid process extending down to the anterior humerus in a deltopectoral approach with a 10-blade scalpel.  Dissected down through the subcutaneous tissues using the Bovie, identified cephalic vein, took it laterally with the deltoid, pectoralis taken medially.  Conjoined tendon identified and retracted medially.  We identified the fractured humerus.  We had to free that up from the underlying deltoid.  There was scarring to the humeral shaft, was pushed up under the deltoid.  The humeral head was freed up from the medial humeral shaft and then the lesser tuberosity was osteotomized, so that that could be freed up with the subscapularis.  We placed #2  FiberWire in a modified W-stitch medial to the lesser tuberosity, gaining excellent tendon purchase.  The greater tuberosity was freed up from the shaft and from the underside of deltoid.  A #2 FiberWire was placed in x3 in modified W-stitch fashion.  Next, we removed the head which was sized to 48 x 18, and then we went ahead and prepared the humeral canal with sequential reaming up to size 12.  We irrigated and inspected the glenoid surface which was intact.  The labrum was normal, biceps released to be tenodesed  later.  We then went ahead and placed our 12 stem with a -5 metaphyseal attachment and then put our 48 x 18 head on that trunnion and reduced the shoulder.  We were happy with positioning, the height and rotation, placing anterior fin adjacent to the biceps groove.  We then removed the trial implants, thoroughly irrigated, placed drill holes centered around the biceps groove with 2 sutures, with 4 suture strands coming out through the humeral shaft for shaft tuberosity fixation.  We then used DePuy 1 cement, vacuum mixing, and cemented the stem into place.  This was a Hospital doctor stem that could be converted to a reverse shoulder if need be later.  Once the stem was placed to the appropriate height with the anterior fin adjacent to the biceps groove, when the cement was allowed to harden, we reduced the shoulder after impacting a 48 x 18 head in place.  We extensively bone grafted the proximal portion of the component, underneath where the lesser tuberosity and greater tuberosities will be placed.  We then reduced the tuberosities to place the suture that had gone through the medial fin of the implant and around the back of the implant, medial to the lesser tuberosity and lateral to the greater tuberosity using around the world stitch.  We also placed sutures in the rotator interval x3.  We then tied the 2 tuberosities to each other with excellent stability.  We then took the sutures from the humeral shaft, and up and over the lesser and greater tuberosities and with mattress sutures in the subscapularis and the rotator cuff.  We were able to nicely compress that and stabilize the shaft to tuberosity.  Through this, one of the sutures coming out of the biceps groove, we put that through the biceps to tenodese the biceps tendon and clipping away the remaining tendon.  We then went ahead and tied around the world stitch, compressing everything nicely and a rotator interval stitch to  the last. We basically reconstituted the upper humerus, everything moving together nicely as a unit with good clearance, no obstruction or adhesions. Actually, nerve was palpated and felt to be intact.  We then thoroughly irrigated the subdeltoid interval and then repaired deltopectoral interval with 0 Vicryl suture, followed by 2-0 Vicryl for subcutaneous closure, and 4-0 Monocryl for the skin.  Steri-Strips and sterile compressive bandage were applied and shoulder sling.  The patient tolerated the procedure well.     Doran Heater. Veverly Fells, M.D.     SRN/MEDQ  D:  03/02/2013  T:  03/03/2013  Job:  [id number]

## 2013-03-03 NOTE — Discharge Summary (Signed)
Physician Discharge Summary   Patient ID: [name] [name] MRN: [id number] DOB/AGE: 1927-03-15 80 y.o.  Admit date: 03/02/2013 Discharge date: 03/03/2013  Admission Diagnoses:  Active Problems:   Fracture of proximal end of left humerus with malunion   Discharge Diagnoses:  Same   Surgeries: Procedure(s): LEFT SHOULDER HEMI-ARTHROPLASTY on 03/02/2013   Consultants: Occupational Therapy, Discharge Planning  Discharged Condition: Stable  Hospital Course: [name] [name] is an 80 y.o. male who was admitted 03/02/2013 with a chief complaint of left shoulder pain, and found to have a diagnosis of left shoulder displaced proximal humerus fracture.  They were brought to the operating room on 03/02/2013 and underwent the above named procedures.    The patient had an uncomplicated hospital course and was stable for discharge.  Recent vital signs:  Filed Vitals:   03/03/13 1300  BP: 114/68  Pulse: 105  Temp: 99.3 F (37.4 C)  Resp: 18    Recent laboratory studies:  Results for orders placed during the hospital encounter of 03/02/13  CBC      Result Value Range   WBC 9.8  4.0 - 10.5 K/uL   RBC 3.79 (*) 4.22 - 5.81 MIL/uL   Hemoglobin 12.0 (*) 13.0 - 17.0 g/dL   HCT 35.4 (*) 39.0 - 52.0 %   MCV 93.4  78.0 - 100.0 fL   MCH 31.7  26.0 - 34.0 pg   MCHC 33.9  30.0 - 36.0 g/dL   RDW 12.9  11.5 - 15.5 %   Platelets 417 (*) 150 - 400 K/uL  BASIC METABOLIC PANEL      Result Value Range   Sodium 137  137 - 147 mEq/L   Potassium 4.4  3.7 - 5.3 mEq/L   Chloride 99  96 - 112 mEq/L   CO2 24  19 - 32 mEq/L   Glucose, Bld 90  70 - 99 mg/dL   BUN 14  6 - 23 mg/dL   Creatinine, Ser 0.77  0.50 - 1.35 mg/dL   Calcium 9.0  8.4 - 10.5 mg/dL   GFR calc non Af Amer 82 (*) >90 mL/min   GFR calc Af Amer >90  >90 mL/min  HEMOGLOBIN AND HEMATOCRIT, BLOOD      Result Value Range   Hemoglobin 9.4 (*) 13.0 - 17.0 g/dL   HCT 27.6 (*) 39.0 - 62.6 %  BASIC METABOLIC PANEL      Result Value Range   Sodium 133 (*) 137 - 147 mEq/L   Potassium 3.8  3.7 - 5.3 mEq/L   Chloride 98  96 - 112 mEq/L   CO2 20  19 - 32 mEq/L   Glucose, Bld 105 (*) 70 - 99 mg/dL   BUN 14  6 - 23 mg/dL   Creatinine, Ser 0.74  0.50 - 1.35 mg/dL   Calcium 7.9 (*) 8.4 - 10.5 mg/dL   GFR calc non Af Amer 83 (*) >90 mL/min   GFR calc Af Amer >90  >90 mL/min    Discharge Medications:     Medication List    ASK your doctor about these medications       acetaminophen 325 MG tablet  Commonly known as:  TYLENOL  Take 325 mg by mouth every 6 (six) hours as needed.     carbidopa-levodopa-entacapone 25-100-200 MG per tablet  Commonly known as:  STALEVO  TAKE 1 TABLET BY MOUTH THREE TIMES DAILY     FLUoxetine 20 MG capsule  Commonly known as:  PROZAC  Take  20 mg by mouth daily.     HYDROcodone-acetaminophen 5-325 MG per tablet  Commonly known as:  NORCO  Take 1 tablet by mouth every 6 (six) hours as needed.        Diagnostic Studies: Dg Chest 1 View  03/02/2013   CLINICAL DATA:  Preop radiograph.  Shoulder surgery.  EXAM: CHEST - 1 VIEW  COMPARISON:  02/24/2010  FINDINGS: The heart size and mediastinal contours are within normal limits. Chronic interstitial coarsening noted bilaterally. Both lungs are clear. Left proximal humerus fracture is identified.  IMPRESSION: 1. No acute cardiopulmonary abnormalities. 2. Left humeral fracture   Electronically Signed   By: [name] [name] M.D.   On: 03/02/2013 11:48   Dg Shoulder Left  03/02/2013   CLINICAL DATA:  Status post left shoulder arthroplasty  EXAM: LEFT SHOULDER - 2+ VIEW  COMPARISON:  DG HUMERUS*L* dated 02/01/2013  FINDINGS: The patient has undergone left shoulder arthroplasty. Radiographic positioning of the prosthetic components is good. There are surgical skin staples present. A preexisting bony fragment in the region of the greater tuberosity of the humerus is again demonstrated.  IMPRESSION: The patient has undergone left shoulder arthroplasty without evidence  of immediate postprocedure complication.   Electronically Signed   By: [name]  [name]   On: 03/02/2013 17:29   Dg Swallowing Func-speech Pathology  02/09/2013   [name] [name], [name] [name]     02/09/2013 12:21 PM Objective Swallowing Evaluation: Modified Barium Swallowing Study   Patient Details  Name: [name] [name] MRN: [id number] Date of Birth: 1927/11/19  Today's Date: 02/09/2013 Time: 1110-1140 SLP Time Calculation (min): 30 min  Past Medical History:  Past Medical History  Diagnosis Date  . Dementia   . Parkinson's disease   . Benign positional vertigo   . Hypertension   . Depression   . Cerebrovascular disease   . Dyslipidemia   . Diverticulosis   . Bundle branch block, right   . Mild aortic insufficiency   . Migraine headache   . History of seizures     Alzheimer's seizures  . Prostate cancer   . Dementia   . Memory loss    Past Surgical History:  Past Surgical History  Procedure Laterality Date  . Radium seed implants      Prostate cancer  . Cholecystectomy    . Appendectomy    . Tonsillectomy     HPI:  Pt is an 80 year old [name] resident, PMH including  Dementia (Parkinsons per SLP). Pt recently evaluated by SLP with  observable signs of aspiration, SNF SLP present for exam.      Assessment / Plan / Recommendation Clinical Impression  Dysphagia Diagnosis: Moderate pharyngeal phase dysphagia;Mild  oral phase dysphagia Clinical impression: Pt presents with a mild to moderate  oropharyngeal dysphagia with decreased lingual strength and  manipulation for clearance of bolus during oral phase. Pharyngeal  phase characterized by weakness throughout the hyolaryngeal  mechanism resulting in decreased peristalsis, incomplete airway  closure and moderate vallecualar and pyriform residuals remaining  post swallow. With thin liquids there is typically trace  penetration above and contacting cords with intially good, fading  to intermittent sensation as study progressed. There were 2-3  instances of sensed  aspiration with thin liquids, typically with  increased bolus size or poor positioning (chin tuck and head tilt  back lead to significant aspiration). With cues to clear throat  forcefully and swallow again pt was able to expel penetrate and  remove most, but not  all residue. No signficant functional  difference between thin and nectar. Recommend dys 2 solids with  pureed meats (which is possible at their facility per SLP) and  thin liquids. Meds given in puree. Consider full supervision for  throat clear and second swallow, brief meals with increased  frequency to limit fatigue. Defer to SLP at [facility] for final  decision regarding diet.     Treatment Recommendation  Defer treatment plan to SLP at (Comment)    Diet Recommendation Dysphagia 2 (Fine chop);Thin liquid   Liquid Administration via: Cup Medication Administration: Whole meds with puree Supervision: Patient able to self feed;Full supervision/cueing  for compensatory strategies Compensations: Slow rate;Small sips/bites;Check for  pocketing;Multiple dry swallows after each bite/sip;Clear throat  intermittently;Effortful swallow Postural Changes and/or Swallow Maneuvers: Seated upright 90  degrees;Upright 30-60 min after meal    Other  Recommendations Oral Care Recommendations: Oral care  before and after PO   Follow Up Recommendations  Skilled Nursing facility    Frequency and Duration        Pertinent Vitals/Pain NA    SLP Swallow Goals     General HPI: Pt is an 80 year old [place] resident, PMH  including Dementia (Parkinsons per SLP). Pt recently evaluated by  SLP with observable signs of aspiration, SNF SLP present for  exam.  Type of Study: Modified Barium Swallowing Study Reason for Referral: Objectively evaluate swallowing function Diet Prior to this Study: Dysphagia 2 (chopped);Nectar-thick  liquids (with pureed meats) Temperature Spikes Noted: No Respiratory Status: Room air History of Recent Intubation: No Behavior/Cognition:  Alert;Cooperative;Pleasant mood;Requires  cueing Oral Cavity - Dentition: Dentures, top;Dentures, bottom Oral Motor / Sensory Function: Impaired motor (weak/tremulous) Self-Feeding Abilities: Able to feed self Patient Positioning: Upright in chair Baseline Vocal Quality: Low vocal intensity Volitional Cough: Strong Volitional Swallow: Able to elicit Anatomy:  (possible small bony protrusion at C5/6) Pharyngeal Secretions: Not observed secondary MBS    Reason for Referral Objectively evaluate swallowing function   Oral Phase Oral Preparation/Oral Phase Oral Phase: Impaired Oral - Nectar Oral - Nectar Cup: Lingual pumping;Reduced posterior  propulsion;Lingual/palatal residue Oral - Thin Oral - Thin Cup: Lingual pumping;Reduced posterior  propulsion;Lingual/palatal residue Oral - Solids Oral - Puree: Weak lingual manipulation;Lingual pumping;Reduced  posterior propulsion;Delayed oral transit;Lingual/palatal residue Oral - Mechanical Soft: Weak lingual manipulation;Lingual  pumping;Reduced posterior propulsion;Delayed oral  transit;Lingual/palatal residue Oral - Pill: Weak lingual manipulation;Lingual pumping;Reduced  posterior propulsion;Delayed oral transit;Lingual/palatal residue   Pharyngeal Phase Pharyngeal Phase Pharyngeal Phase: Impaired Pharyngeal - Nectar Pharyngeal - Nectar Cup: Reduced pharyngeal peristalsis;Reduced  epiglottic inversion;Reduced anterior laryngeal mobility;Reduced  laryngeal elevation;Reduced airway/laryngeal closure;Reduced  tongue base retraction;Penetration/Aspiration during  swallow;Pharyngeal residue - valleculae;Pharyngeal residue -  pyriform sinuses Penetration/Aspiration details (nectar cup): Material enters  airway, remains ABOVE vocal cords and not ejected out;Material  does not enter airway;Material enters airway, remains ABOVE vocal  cords then ejected out Pharyngeal - Thin Pharyngeal - Thin Cup: Reduced pharyngeal peristalsis;Reduced  epiglottic inversion;Reduced anterior  laryngeal mobility;Reduced  laryngeal elevation;Reduced airway/laryngeal closure;Reduced  tongue base retraction;Penetration/Aspiration during  swallow;Pharyngeal residue - valleculae;Pharyngeal residue -  pyriform sinuses;Penetration/Aspiration before swallow;Premature  spillage to pyriform sinuses;Trace aspiration;Significant  aspiration (Amount) Penetration/Aspiration details (thin cup): Material enters  airway, passes BELOW cords then ejected out;Material enters  airway, passes BELOW cords and not ejected out despite cough  attempt by patient;Material enters airway, remains ABOVE vocal  cords then ejected out;Material enters airway, CONTACTS cords  then ejected out;Material enters airway, remains ABOVE vocal  cords and not  ejected out Pharyngeal - Solids Pharyngeal - Puree: Reduced pharyngeal peristalsis;Reduced  epiglottic inversion;Reduced anterior laryngeal mobility;Reduced  laryngeal elevation;Reduced airway/laryngeal closure;Reduced  tongue base retraction;Pharyngeal residue - valleculae;Pharyngeal  residue - pyriform sinuses Penetration/Aspiration details (puree): Material does not enter  airway Pharyngeal - Mechanical Soft: Reduced pharyngeal  peristalsis;Reduced epiglottic inversion;Reduced anterior  laryngeal mobility;Reduced laryngeal elevation;Reduced  airway/laryngeal closure;Reduced tongue base  retraction;Pharyngeal residue - valleculae;Pharyngeal residue -  pyriform sinuses Pharyngeal - Pill: Reduced pharyngeal peristalsis;Reduced  epiglottic inversion;Reduced anterior laryngeal mobility;Reduced  laryngeal elevation;Reduced airway/laryngeal closure;Reduced  tongue base retraction;Pharyngeal residue - valleculae;Pharyngeal  residue - pyriform sinuses  Cervical Esophageal Phase    GO    Cervical Esophageal Phase Cervical Esophageal Phase: Impaired Cervical Esophageal Phase - Comment Cervical Esophageal Comment: slightly decreased opening of UES  from questionable bony protrusion at C5/6     Functional Assessment Tool Used: clinical judgement Functional Limitations: Swallowing Swallow Current Status [id number]): At least 40 percent but less than  60 percent impaired, limited or restricted Swallow Goal Status [phone]): At least 40 percent but less than 60  percent impaired, limited or restricted Swallow Discharge Status [phone]): At least 40 percent but less  than 60 percent impaired, limited or restricted   [name] [name], [place] CCC-SLP [id number]  [name] [name] 02/09/2013, 12:19 PM     Disposition: SNF       Future Appointments Provider Department Dept Phone   04/14/2013 2:00 PM Kathrynn Ducking, MD [facility] [phone]      Follow-up Information   Follow up with Kamden Reber,STEVEN R, MD. Call in 2 weeks. [phone])    Specialty:  Orthopedic Surgery   Contact information:   [address] [place] [address] [phone]        Signed: Augustin Schooling 03/03/2013, 4:55 PM

## 2013-03-03 NOTE — Clinical Social Work Psychosocial (Signed)
Clinical Social Work Department  BRIEF PSYCHOSOCIAL ASSESSMENT  Patient: [name] [name]  Account Number: [id number]   Admit date: 03/02/2013 Clinical Social Worker Rhea Pink, MSW Date/Time: 03/03/2013 Referred by: Physician Date Referred:  Referred for   SNF Placement   Other Referral:  Interview type: Patient's Wife Other interview type: PSYCHOSOCIAL DATA  Living Status: Facility Admitted from facility:  Level of care: SNF Primary support name: Spouse Primary support relationship to patient: [name] [name]  Degree of support available:  Strong and vested  CURRENT CONCERNS  Current Concerns   Post-Acute Placement   Other Concerns:  SOCIAL WORK ASSESSMENT / PLAN  CSW met with pt re: PT recommendation for SNF.   Pt lives with   CSW explained placement process and answered questions.   Pt reports [facility]  as her preference    CSW completed FL2 and initiated SNF search.     Assessment/plan status: Information/Referral to [employer]  Other assessment/ plan:  Information/referral to community resources:  SNF   PTAR  PATIENT'S/FAMILY'S RESPONSE TO PLAN OF CARE:  Pt is from [facility] [place] and can return in the am. Patient's wife  reports she is agreeable to patient returning to SNF.She reported she just can't take care of him anymore.  Pt verbalized understanding of placement process and appreciation for LCSW assist.   Rhea Pink, MSW, [place]

## 2013-03-03 NOTE — Evaluation (Signed)
Physical Therapy Evaluation Patient Details Name: [name] [name] MRN: [id number] DOB: 11-17-1927 Today's Date: 03/03/2013 Time: 1500-1520 PT Time Calculation (min): 20 min  PT Assessment / Plan / Recommendation History of Present Illness  80 yo male admitted with multiple falls, acute L displaced humeral fx. Hx of Parkinson's, dementia. Lived at home with wife up until middle of Jan when he  and she fell. He now lives at [facility]  This patient presents with acute pain and decreased functional independence following the above mentioned procedure. At the time of PT eval, pt was able to perform transfers with +2 assist. This patient is appropriate for skilled PT interventions to address functional limitations, improve safety and independence with functional mobility, and return to PLOF.     PT Assessment  Patient needs continued PT services    Follow Up Recommendations  SNF    Does the patient have the potential to tolerate intense rehabilitation      Barriers to Discharge        Equipment Recommendations  Other (comment) (TBD by next venue of care)    Recommendations for Other Services     Frequency Min 3X/week    Precautions / Restrictions Precautions Precautions: Shoulder Shoulder Interventions: Shoulder sling/immobilizer;Off for dressing/bathing/exercises Required Braces or Orthoses: Sling Restrictions Weight Bearing Restrictions: Yes LUE Weight Bearing: Non weight bearing   Pertinent Vitals/Pain Pt reports some pain in L shoulder, however is unable to report on 0-10 scale.      Mobility  Bed Mobility Overal bed mobility: Needs Assistance Bed Mobility: Supine to Sit Supine to sit: +2 for physical assistance;Total assist General bed mobility comments: VC's for hand placement on bed rails for support, however pt wanted to hold therapists hand to pull from. Bed pad used for total assist transition to EOB, with extra support for trunk to  maintain upright posture.  Transfers Overall transfer level: Needs assistance Equipment used: Rolling walker (2 wheeled) Transfers: Sit to/from [facility] Sit to Stand: Total assist;+2 physical assistance Stand pivot transfers: Total assist;+2 physical assistance General transfer comment: Pt performed sit<>stand x2 (30 second stand each) prior to SPT. Pt very slow with mobility, however was able to pivot around to the recliner with VC's for sequencing.     Exercises     PT Diagnosis: Difficulty walking;Acute pain  PT Problem List: Decreased strength;Decreased range of motion;Decreased activity tolerance;Decreased balance;Decreased mobility;Decreased knowledge of use of DME;Decreased safety awareness;Decreased knowledge of precautions;Pain PT Treatment Interventions: DME instruction;Gait training;Functional mobility training;Therapeutic exercise;Therapeutic activities;Neuromuscular re-education;Patient/family education     PT Goals(Current goals can be found in the care plan section) Acute Rehab PT Goals Patient Stated Goal: Unable due to dementia PT Goal Formulation: With patient Time For Goal Achievement: 03/17/13 Potential to Achieve Goals: Fair  Visit Information  Last PT Received On: 03/03/13 Assistance Needed: +2 (For OOB) History of Present Illness: 80 yo male admitted with multiple falls, acute L displaced humeral fx. Hx of Parkinson's, dementia. Lived at home with wife up until middle of Jan when he  and she fell. He now lives at [facility] expects to be discharged to:: Skilled nursing facility Additional Comments: Pt unable to provide PLOF, home environment info Prior Function Level of Independence: Needs assistance Gait / Transfers Assistance Needed: Min guard A until he fell and broke his shoulder/arm ADL's / Homemaking Assistance Needed: varied up until he broke his LUE, the wife  is a retired Therapist, sports  and she reports that she let him do as much for himself as he could/would since she knows that is important Communication Communication: HOH Dominant Hand: Right    Cognition  Cognition Arousal/Alertness: Awake/alert Behavior During Therapy: WFL for tasks assessed/performed Overall Cognitive Status: History of cognitive impairments - at baseline    Extremity/Trunk Assessment Upper Extremity Assessment Upper Extremity Assessment: Defer to OT evaluation Lower Extremity Assessment Lower Extremity Assessment: Generalized weakness Cervical / Trunk Assessment Cervical / Trunk Assessment: Kyphotic   Balance Balance Overall balance assessment: History of Falls  End of Session PT - End of Session Equipment Utilized During Treatment: Gait belt;Other (comment) (LUE sling) Activity Tolerance: Patient limited by fatigue Patient left: in chair;with call bell/phone within reach Nurse Communication: Mobility status  GP     Jolyn Lent 03/03/2013, 3:53 PM  Jolyn Lent, [place], DPT [phone]

## 2013-03-03 NOTE — Progress Notes (Signed)
This encounter was created in error - please disregard.

## 2013-03-03 NOTE — Evaluation (Signed)
Occupational Therapy Evaluation Patient Details Name: [name] [name] MRN: [id number] DOB: 1927-12-11 Today's Date: 03/03/2013 Time: 5643-3295 OT Time Calculation (min): 32 min  OT Assessment / Plan / Recommendation History of present illness 80 yo male admitted with multiple falls, acute L displaced humeral fx. Hx of Parkinson's, dementia. Lived at home with wife up until middle of Jan when he  and she fell. He now lives at [facility]   This 80 yo s/p fall and now s/p LEFT SHOULDER HEMI-ARTHROPLASTY  Presents to acute OT with decreased A/AAROM of LUE, h/o Parkinsons, h/o dementia, increased pain LUE all affecting pt's ability to A with his BADLs and associated mobility. Will benefit from acute OT with follow up at SNF.    OT Assessment  Patient needs continued OT Services    Follow Up Recommendations  SNF       Equipment Recommendations  None recommended by OT       Frequency  Min 3X/week    Precautions / Restrictions Precautions Precautions: Shoulder Shoulder Interventions: Shoulder sling/immobilizer;Off for dressing/bathing/exercises Required Braces or Orthoses: Sling Restrictions Weight Bearing Restrictions: Yes LUE Weight Bearing: Non weight bearing   Pertinent Vitals/Pain FACES 6; repositioned and made RN aware that pt needed an ice bag.    ADL  ADL Comments: At this time pt is total A for all BADLs due to LUE fracture and now S/P surgery    OT Diagnosis: Generalized weakness;Cognitive deficits;Acute pain  OT Problem List: Decreased strength;Decreased range of motion;Impaired UE functional use;Pain;Decreased cognition;Decreased coordination;Increased edema OT Treatment Interventions: Self-care/ADL training;Patient/family education;Balance training;Therapeutic exercise;Therapeutic activities   OT Goals(Current goals can be found in the care plan section) Acute Rehab OT Goals Patient Stated Goal: Unable due to dementia OT Goal Formulation:  With family Time For Goal Achievement: 03/10/13 Potential to Achieve Goals: Good  Visit Information  Last OT Received On: 03/03/13 Assistance Needed: +2 (for OOB) History of Present Illness: 80 yo male admitted with multiple falls, acute L displaced humeral fx. Hx of Parkinson's, dementia. Lived at home with wife up until middle of Jan when he  and she fell. He now lives at [facility] expects to be discharged to:: Skilled nursing facility Prior Function Level of Independence: Needs assistance Gait / Transfers Assistance Needed: Min guard A until he fell and broke his shoulder/arm ADL's / Homemaking Assistance Needed: varied up until he broke his LUE, the wife is a retired Therapist, sports and she reports that she let him do as much for himself as he could/would since she knows that is important Communication Communication: HOH Dominant Hand: Right         Vision/Perception Vision - History Patient Visual Report: No change from baseline   Cognition  Cognition Arousal/Alertness: Awake/alert Behavior During Therapy: WFL for tasks assessed/performed Overall Cognitive Status: History of cognitive impairments - at baseline (kept asking where is mother and sister were)    Extremity/Trunk Assessment Upper Extremity Assessment Upper Extremity Assessment: LUE deficits/detail LUE Deficits / Details: s/p hemiarthoplasty shoulder this admission. mild edema, decreased tolerance of AAROM elbow and shoulder LUE Coordination: decreased gross motor;decreased fine motor        Exercise Other Exercises Other Exercises: 10 reps of AAROM of shoulder flex/ext (tolerated only about 10-20 degrees), shoulder abduction/adduction (tolerated only about 10-20 degrees), shoulder external/internal rotation (only tolerated about 20 degrees external rotation from full internal rotation); flex/extension of elbow  full, forearm supination only to neutral, wrist  full , digit 2/3 range (seems to have some OA)      End of Session OT - End of Session Activity Tolerance: Patient limited by pain Patient left: in bed;with bed alarm set Nurse Communication:  (recommend at PT eval)       Almon Register [phone] 03/03/2013, 11:05 AM

## 2013-03-04 MED ORDER — HYDROCODONE-ACETAMINOPHEN 5-325 MG PO TABS: 1.0000 | ORAL_TABLET | ORAL | Status: DC | PRN

## 2013-03-04 NOTE — Progress Notes (Signed)
Patient is medically stable for D/C today to [facility] (SNF). Manager at [facility] reported that they can accept patient back today. CSW prepared D/C packet and arranged non-emergency EMS ([facility]) for transport. Patient came to [facility] with a [employer] chair. RN is putting wheel chair in utility closet and [facility] is sending their driver Monday to retreive the wheel chair. Patient's wife Romie Minus and daughter in law Colletta [place] are aware of above. Nursing is aware of above. Please reconsult if further social work needs arise. CSW signing off.   Blima Rich, [place] Weekend CSW [phone]

## 2013-03-04 NOTE — Progress Notes (Signed)
[name] [name]  MRN: [id number] DOB/Age: 1927-04-12 80 y.o. Physician: Rada Hay Procedure: Procedure(s) (LRB): LEFT SHOULDER HEMI-ARTHROPLASTY (Left)     Subjective: Pt awake and alert  Vital Signs Temp:  [98.1 F (36.7 C)-99.3 F (37.4 C)] 98.1 F (36.7 C) (02/07 0531) Pulse Rate:  [99-105] 99 (02/07 0531) Resp:  [18-20] 20 (02/07 0531) BP: (111-132)/(68-78) 111/78 mmHg (02/07 0531) SpO2:  [99 %-100 %] 100 % (02/07 0531)  Lab Results  Recent Labs  03/02/13 1106 03/03/13 0345  WBC 9.8  --   HGB 12.0* 9.4*  HCT 35.4* 27.6*  PLT 417*  --    BMET  Recent Labs  03/02/13 1106 03/03/13 0345  NA 137 133*  K 4.4 3.8  CL 99 98  CO2 24 20  GLUCOSE 90 105*  BUN 14 14  CREATININE 0.77 0.74  CALCIUM 9.0 7.9*   No results found for this basename: inr     Exam Left shoulder incision dry. NVI        Plan Bed available at [facility] today for skilled care  [facility] for Dr.Kevin Supple 03/04/2013, 10:05 AM

## 2013-03-04 NOTE — Progress Notes (Signed)
Report called to Enid Derry, Therapist, sports at [facility].

## 2013-03-04 NOTE — Progress Notes (Signed)
Occupational Therapy Treatment Patient Details Name: [name] [name] MRN: [id number] DOB: 1927/02/08 Today's Date: 03/04/2013 Time: 3474-2595 OT Time Calculation (min): 8 min  OT Assessment / Plan / Recommendation  History of present illness  Pt was admitted for L shoulder hemiarthroplasty   OT comments  Pt initially had no pain when RN was in room and when OT arrived, but c/o pain in hand after several reps of passive shoulder flexion.  Could not continue with exercises after pain meds given time to take effect as transport here and pt on stretcher.    Follow Up Recommendations  SNF    Barriers to Discharge       Equipment Recommendations  None recommended by OT    Recommendations for Other Services    Frequency     Progress towards OT Goals Progress towards OT goals: Progressing toward goals (slowly)  Plan      Precautions / Restrictions Precautions Precautions: Shoulder Type of Shoulder Precautions: ROM within pain tolerance Required Braces or Orthoses: Sling Restrictions LUE Weight Bearing: Non weight bearing   Pertinent Vitals/Pain Initially none then c/o hand hurting.  Pt unable to rate. Repositioned and requested pain meds    ADL       OT Diagnosis:    OT Problem List:   OT Treatment Interventions:     OT Goals(current goals can now be found in the care plan section)    Visit Information  Last OT Received On: 03/04/13    Subjective Data      Prior Functioning       Cognition  Cognition Arousal/Alertness: Awake/alert Behavior During Therapy: [facility] for tasks assessed/performed Overall Cognitive Status: History of cognitive impairments - at baseline    Mobility       Exercises  Other Exercises Other Exercises: only tolerated 5 reps of PROM shoulder flexion to about 15 degrees then pt states that his hand hurt.  Requested pain medication.  Checked back but pt on stretcher getting ready for transfer.  Sling had been reapplied   Balance    End of  Session OT - End of Session Activity Tolerance: Patient limited by pain Patient left: in bed;with bed alarm set  [place] 03/04/2013, 2:21 PM Lesle Chris, OTR/L [phone] 03/04/2013

## 2013-03-06 ENCOUNTER — Other Ambulatory Visit: Payer: Self-pay | Admitting: *Deleted

## 2013-03-06 ENCOUNTER — Encounter (HOSPITAL_COMMUNITY): Payer: Self-pay | Admitting: Orthopedic Surgery

## 2013-03-06 MED ORDER — HYDROCODONE-ACETAMINOPHEN 5-325 MG PO TABS: ORAL_TABLET | ORAL | Status: DC

## 2013-03-06 NOTE — Telephone Encounter (Signed)
[facility] [place] 

## 2013-03-10 ENCOUNTER — Non-Acute Institutional Stay (SKILLED_NURSING_FACILITY): Payer: Medicare Other | Admitting: Internal Medicine

## 2013-03-10 ENCOUNTER — Encounter: Payer: Self-pay | Admitting: Internal Medicine

## 2013-03-10 DIAGNOSIS — F01518 Vascular dementia, unspecified severity, with other behavioral disturbance: Secondary | ICD-10-CM

## 2013-03-10 DIAGNOSIS — F0151 Vascular dementia with behavioral disturbance: Secondary | ICD-10-CM

## 2013-03-10 DIAGNOSIS — F0281 Dementia in other diseases classified elsewhere with behavioral disturbance: Secondary | ICD-10-CM

## 2013-03-10 DIAGNOSIS — F02818 Dementia in other diseases classified elsewhere, unspecified severity, with other behavioral disturbance: Secondary | ICD-10-CM

## 2013-03-10 DIAGNOSIS — IMO0002 Reserved for concepts with insufficient information to code with codable children: Secondary | ICD-10-CM

## 2013-03-10 DIAGNOSIS — K5900 Constipation, unspecified: Secondary | ICD-10-CM

## 2013-03-10 DIAGNOSIS — R1319 Other dysphagia: Secondary | ICD-10-CM

## 2013-03-10 DIAGNOSIS — F0150 Vascular dementia without behavioral disturbance: Secondary | ICD-10-CM

## 2013-03-10 DIAGNOSIS — G20 Parkinson's disease: Secondary | ICD-10-CM

## 2013-03-10 DIAGNOSIS — F411 Generalized anxiety disorder: Secondary | ICD-10-CM

## 2013-03-10 DIAGNOSIS — R269 Unspecified abnormalities of gait and mobility: Secondary | ICD-10-CM

## 2013-03-10 DIAGNOSIS — S42202P Unspecified fracture of upper end of left humerus, subsequent encounter for fracture with malunion: Secondary | ICD-10-CM

## 2013-03-10 NOTE — Progress Notes (Signed)
Patient ID: [name] [name], male   DOB: [birth year], 80 y.o.   MRN: [id number]    [name] [name] living [place]    PCP: [name] Argyle, MD   Allergies  Allergen Reactions  . Aricept [Donepezil Hcl]   . Zetia [Ezetimibe]   . Lipitor [Atorvastatin]     Chief Complaint: new admit  HPI:  80 y/o male patient is here for STR after hospital admission from 03/02/13- 03/03/13 with fracture of proximal end of left humerus with malunion. He underwent left shoulder hemiarthroplasty.He has hx of dementia and parkinson disease. He recently had a fall and left humerus fracture. He has a splint in place. He is unable to provide hpi or ROS. He is alert and oriented only to person. He is in no distress. He has been working some with therapy. His tremor limits his mobility and has shuffling gait. He is under total care  ROS Unable to obtain from patient No concern from staff   Past Medical History  Diagnosis Date  . Dementia   . Parkinson's disease   . Benign positional vertigo   . Hypertension   . Depression   . Cerebrovascular disease   . Dyslipidemia   . Diverticulosis   . Bundle branch block, right   . Mild aortic insufficiency   . History of seizures     Alzheimer's seizures  . Prostate cancer   . Dementia   . Memory loss   . Stroke   . Migraine headache     wife states he does not have migraines   Past Surgical History  Procedure Laterality Date  . Radium seed implants      Prostate cancer  . Cholecystectomy    . Appendectomy    . Tonsillectomy    . Cardiac catheterization      wife states had several years ago and that was clear.  . Shoulder hemi-arthroplasty Left 03/02/2013    Procedure: LEFT SHOULDER HEMI-ARTHROPLASTY;  Surgeon: [name] Schooling, MD;  Location: [facility];  Service: Orthopedics;  Laterality: Left;   Social History:   reports that he quit smoking about 45 years ago. He has never used smokeless tobacco. He reports that he drinks alcohol. He reports that he does  not use illicit drugs.  Family History  Problem Relation Age of Onset  . Heart attack Father     Medications: Patient's Medications  New Prescriptions   No medications on file  Previous Medications   ACETAMINOPHEN (TYLENOL) 325 MG TABLET    Take 325 mg by mouth every 6 (six) hours as needed.   CARBIDOPA-LEVODOPA-ENTACAPONE (STALEVO) 25-100-200 MG PER TABLET    Take 1 tablet by mouth 3 (three) times daily.   DOCUSATE SODIUM (COLACE) 100 MG CAPSULE    Take 100 mg by mouth 2 (two) times daily.   FLUOXETINE (PROZAC) 20 MG CAPSULE    Take 20 mg by mouth daily.   HYDROCODONE-ACETAMINOPHEN (NORCO) 5-325 MG PER TABLET    Take one tablet by mouth every 6 hours as needed for pain   MIRTAZAPINE (REMERON) 7.5 MG TABLET    Take 7.5 mg by mouth at bedtime.  Modified Medications   No medications on file  Discontinued Medications   No medications on file     Physical Exam:  Filed Vitals:   03/10/13 1013  BP: 122/60  Pulse: 68  Temp: 98.1 F (36.7 C)  Resp: 18  Height: 5\' 10"  (1.778 m)  Weight: 120 lb (54.432 kg)  SpO2: 98%  Constitutional: He appears frail, elderly male in no distress   HENT:   Head: Normocephalic.  Abrasion midline fore head   Eyes: Pupils are equal, round, and reactive to light.   Neck:  Normal ROM   Cardiovascular: Normal rate.    Pulmonary/Chest: Effort normal. CTAB Abdominal: Soft. He exhibits no distension. There is no tenderness.  Musculoskeletal:  Pill rolling tremor in right hand, able to move digits Skin: Skin is warm and dry Neurological: follows few commands Psych: oriented to person only, normal mood   Labs reviewed: Basic Metabolic Panel:  Recent Labs  02/01/13 0830 03/02/13 1106 03/03/13 0345  NA 132* 137 133*  K 3.6* 4.4 3.8  CL 97 99 98  CO2 22 24 20   GLUCOSE 131* 90 105*  BUN 18 14 14   CREATININE 0.85 0.77 0.74  CALCIUM 8.7 9.0 7.9*   Liver Function Tests:  Recent Labs  02/01/13 0830  AST 28  ALT 16  ALKPHOS 41    BILITOT 0.8  PROT 6.3  ALBUMIN 3.5   No results found for this basename: LIPASE, AMYLASE,  in the last 8760 hours No results found for this basename: AMMONIA,  in the last 8760 hours CBC:  Recent Labs  02/01/13 0830 03/02/13 1106 03/03/13 0345  WBC 13.1* 9.8  --   HGB 12.5* 12.0* 9.4*  HCT 34.7* 35.4* 27.6*  MCV 88.7 93.4  --   PLT 184 417*  --     Radiological Exams: Dg Chest 1 View  03/02/2013   CLINICAL DATA:  Preop radiograph.  Shoulder surgery.  EXAM: CHEST - 1 VIEW  COMPARISON:  02/24/2010  FINDINGS: The heart size and mediastinal contours are within normal limits. Chronic interstitial coarsening noted bilaterally. Both lungs are clear. Left proximal humerus fracture is identified.  IMPRESSION: 1. No acute cardiopulmonary abnormalities. 2. Left humeral fracture   Electronically Signed   By: Kerby Moors M.D.   On: 03/02/2013 11:48   Dg Shoulder Left  03/02/2013   CLINICAL DATA:  Status post left shoulder arthroplasty  EXAM: LEFT SHOULDER - 2+ VIEW  COMPARISON:  DG HUMERUS*L* dated 02/01/2013  FINDINGS: The patient has undergone left shoulder arthroplasty. Radiographic positioning of the prosthetic components is good. There are surgical skin staples present. A preexisting bony fragment in the region of the greater tuberosity of the humerus is again demonstrated.  IMPRESSION: The patient has undergone left shoulder arthroplasty without evidence of immediate postprocedure complication.   Electronically Signed   By: David  Martinique   On: 03/02/2013 17:29   Dg Swallowing Func-speech Pathology  02/09/2013   Katherene Ponto [place], [place]     02/09/2013 12:21 PM Objective Swallowing Evaluation: Modified Barium Swallowing Study   Patient Details  Name: [name] [name] MRN: [id number] Date of Birth: [birth year]  Today's Date: 02/09/2013 Time: 1110-1140 SLP Time Calculation (min): 30 min  Past Medical History:  Past Medical History  Diagnosis Date  . Dementia   . Parkinson's disease   . Benign  positional vertigo   . Hypertension   . Depression   . Cerebrovascular disease   . Dyslipidemia   . Diverticulosis   . Bundle branch block, right   . Mild aortic insufficiency   . Migraine headache   . History of seizures     Alzheimer's seizures  . Prostate cancer   . Dementia  . Memory loss    Past Surgical History:  Past Surgical History  Procedure Laterality Date . Radium seed implants  Prostate cancer  . Cholecystectomy    . Appendectomy    . Tonsillectomy     HPI:  Pt is an 80 year old [facility] resident, PMH including  Dementia (Parkinsons per SLP). Pt recently evaluated by SLP with  observable signs of aspiration, SNF SLP present for exam.      Assessment / Plan / Recommendation Clinical Impression  Dysphagia Diagnosis: Moderate pharyngeal phase dysphagia;Mild  oral phase dysphagia Clinical impression: Pt presents with a mild to moderate  oropharyngeal dysphagia with decreased lingual strength and  manipulation for clearance of bolus during oral phase. Pharyngeal  phase characterized by weakness throughout the hyolaryngeal  mechanism resulting in decreased peristalsis, incomplete airway  closure and moderate vallecualar and pyriform residuals remaining  post swallow. With thin liquids there is typically trace  penetration above and contacting cords with intially good, fading  to intermittent sensation as study progressed. There were 2-3  instances of sensed aspiration with thin liquids, typically with  increased bolus size or poor positioning (chin tuck and head tilt  back lead to significant aspiration). With cues to clear throat  forcefully and swallow again pt was able to expel penetrate and  remove most, but not all residue. No signficant functional  difference between thin and nectar. Recommend dys 2 solids with  pureed meats (which is possible at their facility per SLP) and  thin liquids. Meds given in puree. Consider full supervision for  throat clear and second swallow, brief meals with  increased  frequency to limit fatigue. Defer to SLP at [facility] for final  decision regarding diet.     Treatment Recommendation  Defer treatment plan to SLP at (Comment)    Diet Recommendation Dysphagia 2 (Fine chop);Thin liquid   Liquid Administration via: Cup Medication Administration: Whole meds with puree Supervision: Patient able to self feed;Full supervision/cueing  for compensatory strategies Compensations: Slow rate;Small sips/bites;Check for  pocketing;Multiple dry swallows after each bite/sip;Clear throat  intermittently;Effortful swallow Postural Changes and/or Swallow Maneuvers: Seated upright 90  degrees;Upright 30-60 min after meal    Other  Recommendations Oral Care Recommendations: Oral care  before and after PO   Follow Up Recommendations  Skilled Nursing facility    Frequency and Duration        Pertinent Vitals/Pain NA    SLP Swallow Goals     General HPI: Pt is an 80 year old [place] resident, PMH  including Dementia (Parkinsons per SLP). Pt recently evaluated by  SLP with observable signs of aspiration, SNF SLP present for  exam.  Type of Study: Modified Barium Swallowing Study Reason for Referral: Objectively evaluate swallowing function Diet Prior to this Study: Dysphagia 2 (chopped);Nectar-thick  liquids (with pureed meats) Temperature Spikes Noted: No Respiratory Status: Room air History of Recent Intubation: No Behavior/Cognition: Alert;Cooperative;Pleasant mood;Requires  cueing Oral Cavity - Dentition: Dentures, top;Dentures, bottom Oral Motor / Sensory Function: Impaired motor (weak/tremulous) Self-Feeding Abilities: Able to feed self Patient Positioning: Upright in chair Baseline Vocal Quality: Low vocal intensity Volitional Cough: Strong Volitional Swallow: Able to elicit Anatomy:  (possible small bony protrusion at C5/6) Pharyngeal Secretions: Not observed secondary MBS    Reason for Referral Objectively evaluate swallowing function   Oral Phase Oral Preparation/Oral Phase Oral  Phase: Impaired Oral - Nectar Oral - Nectar Cup: Lingual pumping;Reduced posterior  propulsion;Lingual/palatal residue Oral - Thin Oral - Thin Cup: Lingual pumping;Reduced posterior  propulsion;Lingual/palatal residue Oral - Solids Oral - Puree: Weak lingual manipulation;Lingual pumping;Reduced  posterior propulsion;Delayed oral transit;Lingual/palatal residue  Oral - Mechanical Soft: Weak lingual manipulation;Lingual  pumping;Reduced posterior propulsion;Delayed oral  transit;Lingual/palatal residue Oral - Pill: Weak lingual manipulation;Lingual pumping;Reduced  posterior propulsion;Delayed oral transit;Lingual/palatal residue   Pharyngeal Phase Pharyngeal Phase Pharyngeal Phase: Impaired Pharyngeal - Nectar Pharyngeal - Nectar Cup: Reduced pharyngeal peristalsis;Reduced  epiglottic inversion;Reduced anterior laryngeal mobility;Reduced  laryngeal elevation;Reduced airway/laryngeal closure;Reduced  tongue base retraction;Penetration/Aspiration during  swallow;Pharyngeal residue - valleculae;Pharyngeal residue -  pyriform sinuses Penetration/Aspiration details (nectar cup): Material enters  airway, remains ABOVE vocal cords and not ejected out;Material  does not enter airway;Material enters airway, remains ABOVE vocal  cords then ejected out Pharyngeal - Thin Pharyngeal - Thin Cup: Reduced pharyngeal peristalsis;Reduced  epiglottic inversion;Reduced anterior laryngeal mobility;Reduced  laryngeal elevation;Reduced airway/laryngeal closure;Reduced  tongue base retraction;Penetration/Aspiration during  swallow;Pharyngeal residue - valleculae;Pharyngeal residue -  pyriform sinuses;Penetration/Aspiration before swallow;Premature  spillage to pyriform sinuses;Trace aspiration;Significant  aspiration (Amount) Penetration/Aspiration details (thin cup): Material enters  airway, passes BELOW cords then ejected out;Material enters  airway, passes BELOW cords and not ejected out despite cough  attempt by patient;Material enters  airway, remains ABOVE vocal  cords then ejected out;Material enters airway, CONTACTS cords  then ejected out;Material enters airway, remains ABOVE vocal  cords and not ejected out Pharyngeal - Solids Pharyngeal - Puree: Reduced pharyngeal peristalsis;Reduced  epiglottic inversion;Reduced anterior laryngeal mobility;Reduced  laryngeal elevation;Reduced airway/laryngeal closure;Reduced  tongue base retraction;Pharyngeal residue - valleculae;Pharyngeal  residue - pyriform sinuses Penetration/Aspiration details (puree): Material does not enter  airway Pharyngeal - Mechanical Soft: Reduced pharyngeal  peristalsis;Reduced epiglottic inversion;Reduced anterior  laryngeal mobility;Reduced laryngeal elevation;Reduced  airway/laryngeal closure;Reduced tongue base  retraction;Pharyngeal residue - valleculae;Pharyngeal residue -  pyriform sinuses Pharyngeal - Pill: Reduced pharyngeal peristalsis;Reduced  epiglottic inversion;Reduced anterior laryngeal mobility;Reduced  laryngeal elevation;Reduced airway/laryngeal closure;Reduced  tongue base retraction;Pharyngeal residue - valleculae;Pharyngeal  residue - pyriform sinuses  Cervical Esophageal Phase    GO    Cervical Esophageal Phase Cervical Esophageal Phase: Impaired Cervical Esophageal Phase - Comment Cervical Esophageal Comment: slightly decreased opening of UES  from questionable bony protrusion at C5/6    Functional Assessment Tool Used: clinical judgement Functional Limitations: Swallowing Swallow Current Status ([id number]): At least 40 percent but less than  60 percent impaired, limited or restricted Swallow Goal Status [phone]): At least 40 percent but less than 60  percent impaired, limited or restricted Swallow Discharge Status [phone]): At least 40 percent but less  than 60 percent impaired, limited or restricted   Herbie [place], [place] CCC-SLP [phone]  Lynann Beaver 02/09/2013, 12:19 PM      Assessment/Plan  Unsteady gait To work with PT and OT for gait  training. Fall precautions. His parkinsons will be the main limitation  Fracture of left humerus S/p left shoulder arthroplasty. With worsened pain, Will have him on oxycodone 5 mg bid and change norco to 5-325 q4h prn for breakthrough pain. Has orthopedic follow up  Parkinson's disease Continue sinemet tid, monitor gait, monitor behavior  Constipation Continue colace  Anxiety Continue his prozac  Dementia In setting of vascular complication and parkisnon, continue sinemet. Bp stable on current med. Continue this. Frequent cueing. Skin care. Aspiration and fall precautions  dysphagia Aspiration precautions  Family/ staff Communication: reviewed care plan with patient and nursing supervisor   Goals of care: STR   Labs/tests ordered- cbc, bmp in 1 week  Blanchie Serve, MD  [place] [phone] (Monday-Friday 8 am - 5 pm) [phone] (afterhours)

## 2013-03-13 ENCOUNTER — Other Ambulatory Visit: Payer: Self-pay | Admitting: *Deleted

## 2013-03-13 MED ORDER — OXYCODONE HCL 5 MG PO TABS: ORAL_TABLET | ORAL | Status: DC

## 2013-03-13 MED ORDER — HYDROCODONE-ACETAMINOPHEN 5-325 MG PO TABS: ORAL_TABLET | ORAL | Status: DC

## 2013-03-13 NOTE — Telephone Encounter (Signed)
[facility] [place] 

## 2013-04-06 ENCOUNTER — Other Ambulatory Visit: Payer: Self-pay | Admitting: *Deleted

## 2013-04-06 MED ORDER — OXYCODONE HCL 5 MG PO TABS: ORAL_TABLET | ORAL | Status: DC

## 2013-04-06 NOTE — Telephone Encounter (Signed)
[facility] [place] 

## 2013-04-10 ENCOUNTER — Encounter: Payer: Self-pay | Admitting: Internal Medicine

## 2013-04-10 ENCOUNTER — Non-Acute Institutional Stay (SKILLED_NURSING_FACILITY): Payer: Medicare Other | Admitting: Internal Medicine

## 2013-04-10 DIAGNOSIS — E46 Unspecified protein-calorie malnutrition: Secondary | ICD-10-CM

## 2013-04-10 DIAGNOSIS — F02818 Dementia in other diseases classified elsewhere, unspecified severity, with other behavioral disturbance: Secondary | ICD-10-CM

## 2013-04-10 DIAGNOSIS — G20A1 Parkinson's disease without dyskinesia, without mention of fluctuations: Secondary | ICD-10-CM

## 2013-04-10 DIAGNOSIS — G20 Parkinson's disease: Secondary | ICD-10-CM

## 2013-04-10 DIAGNOSIS — F0281 Dementia in other diseases classified elsewhere with behavioral disturbance: Secondary | ICD-10-CM

## 2013-04-10 DIAGNOSIS — F0280 Dementia in other diseases classified elsewhere without behavioral disturbance: Secondary | ICD-10-CM

## 2013-04-10 DIAGNOSIS — S42202P Unspecified fracture of upper end of left humerus, subsequent encounter for fracture with malunion: Secondary | ICD-10-CM

## 2013-04-10 DIAGNOSIS — F0393 Unspecified dementia, unspecified severity, with mood disturbance: Secondary | ICD-10-CM

## 2013-04-10 DIAGNOSIS — F329 Major depressive disorder, single episode, unspecified: Secondary | ICD-10-CM

## 2013-04-10 DIAGNOSIS — IMO0002 Reserved for concepts with insufficient information to code with codable children: Secondary | ICD-10-CM

## 2013-04-10 DIAGNOSIS — R269 Unspecified abnormalities of gait and mobility: Secondary | ICD-10-CM

## 2013-04-10 DIAGNOSIS — F3289 Other specified depressive episodes: Secondary | ICD-10-CM

## 2013-04-10 DIAGNOSIS — F01518 Vascular dementia, unspecified severity, with other behavioral disturbance: Secondary | ICD-10-CM

## 2013-04-10 DIAGNOSIS — F0150 Vascular dementia without behavioral disturbance: Secondary | ICD-10-CM

## 2013-04-10 DIAGNOSIS — F0151 Vascular dementia with behavioral disturbance: Secondary | ICD-10-CM

## 2013-04-10 NOTE — Progress Notes (Signed)
Patient ID: [name] [name], male   DOB: March 15, 1927, 80 y.o.   MRN: [id number]    [name] living [facility] Chief Complaint  Patient presents with  . Medical Managment of Chronic Issues   Allergies  Allergen Reactions  . Aricept [Donepezil Hcl]   . Zetia [Ezetimibe]   . Lipitor [Atorvastatin]    HPI 80 y/o male patient is here for STR after hospital admission with fracture of proximal end of left humerus with malunion. He is s/p left shoulder hemiarthroplasty.He has hx of dementia and parkinson disease with gait disturbance and tremors. He is having tough time participating with therapy with his memory loss being the main barrier. He is a high fall risk at present. He is in no distress.He is under total care  ROS Unable to obtain from patient No concern from staff  Past Medical History  Diagnosis Date  . Dementia   . Parkinson's disease   . Benign positional vertigo   . Hypertension   . Depression   . Cerebrovascular disease   . Dyslipidemia   . Diverticulosis   . Bundle branch block, right   . Mild aortic insufficiency   . History of seizures     Alzheimer's seizures  . Prostate cancer   . Dementia   . Memory loss   . Stroke   . Migraine headache     wife states he does not have migraines   Past Surgical History  Procedure Laterality Date  . Radium seed implants      Prostate cancer  . Cholecystectomy    . Appendectomy    . Tonsillectomy    . Cardiac catheterization      wife states had several years ago and that was clear.  . Shoulder hemi-arthroplasty Left 03/02/2013    Procedure: LEFT SHOULDER HEMI-ARTHROPLASTY;  Surgeon: Augustin Schooling, MD;  Location: [facility];  Service: Orthopedics;  Laterality: Left;   Current Outpatient Prescriptions on File Prior to Visit  Medication Sig Dispense Refill  . acetaminophen (TYLENOL) 325 MG tablet Take 325 mg by mouth every 6 (six) hours as needed.      . docusate sodium (COLACE) 100 MG capsule Take 100 mg by mouth 2 (two)  times daily.      Marland Kitchen FLUoxetine (PROZAC) 20 MG capsule Take 20 mg by mouth daily.      Marland Kitchen HYDROcodone-acetaminophen (NORCO) 5-325 MG per tablet Take one tablet by mouth every 4 hours as needed for pain  180 tablet  0  . mirtazapine (REMERON) 7.5 MG tablet Take 7.5 mg by mouth at bedtime.      Marland Kitchen oxyCODONE (ROXICODONE) 5 MG immediate release tablet Take one tablet by mouth twice daily for pain  60 tablet  0   No current facility-administered medications on file prior to visit.   Family History  Problem Relation Age of Onset  . Heart attack Father    Physical exam BP 113/58  Pulse 81  Temp(Src) 96.8 F (36 C)  Resp 16  SpO2 95%  Constitutional: elderly male in no distress, frail HENT:   Head: Normocephalic.  Eyes: Pupils are equal, round, and reactive to light.   Neck: Normal ROM   Cardiovascular: Normal rate.    Pulmonary/Chest: Effort normal. CTAB Abdominal: Soft. He exhibits no distension. There is no tenderness.  Musculoskeletal: Pill rolling tremor in right hand, able to move digits Skin: Skin is warm and dry Neurological: follows few commands Psych: oriented to person only  Assessment/plan  Parkinson's disease Continue  sinemet tid, monitor gait, monitor behavior. Fall precautions. Has upcoming neurology follow up  Protein calorie malnutrition Continue remeron to help with mood and appetite for now. Encourage and assist with po intake Unsteady gait To work with PT and OT for gait training. Fall precautions. continue parkison's disease medication  Fracture of left humerus S/p left shoulder arthroplasty. Pain under control with oxycodone 5 mg bid and norco to 5-325 q4h prn for breakthrough pain. Has orthopedic follow up  Constipation Continue docusate and monitor  Dementia  Persists. bp remains stable. Continue sinemet. Decline anticipated. Aspiration precautions, SLP team following.  Depression with dementia Continue fluoxetine for now and monitor

## 2013-04-14 ENCOUNTER — Ambulatory Visit (INDEPENDENT_AMBULATORY_CARE_PROVIDER_SITE_OTHER): Payer: Medicare Other | Admitting: Neurology

## 2013-04-14 ENCOUNTER — Encounter (INDEPENDENT_AMBULATORY_CARE_PROVIDER_SITE_OTHER): Payer: Self-pay

## 2013-04-14 ENCOUNTER — Encounter: Payer: Self-pay | Admitting: Neurology

## 2013-04-14 VITALS — BP 112/71 | HR 88

## 2013-04-14 DIAGNOSIS — G20 Parkinson's disease: Secondary | ICD-10-CM

## 2013-04-14 DIAGNOSIS — R413 Other amnesia: Secondary | ICD-10-CM

## 2013-04-14 DIAGNOSIS — R269 Unspecified abnormalities of gait and mobility: Secondary | ICD-10-CM

## 2013-04-14 MED ORDER — CARBIDOPA-LEVODOPA-ENTACAPONE 31.25-125-200 MG PO TABS: 1.0000 | ORAL_TABLET | Freq: Three times a day (TID) | ORAL | Status: AC

## 2013-04-14 NOTE — Patient Instructions (Signed)

## 2013-04-14 NOTE — Progress Notes (Signed)
Reason for visit: Parkinson's disease  [name] [name] is an 80 y.o. male  History of present illness:  [name] [name] is an 80 year old right-handed white male with a history of Parkinson's disease and a progressive dementia. The patient has developed a significant gait disturbance, and he fell and fractured his left humerus requiring an admission to the hospital on March 03, 2013. The patient required surgery, and he is now in an extended care facility for rehabilitation. The patient is working with a therapist, but he is essentially nonambulatory at this time. The patient unfortunately will try to get up on his own and ambulate independently, putting him at risk for another fall. The patient is very hard of hearing, and he has a lot of problems understanding what is being said to him. The patient has had significant issues with being malnourished, and he continues to indicate that he has problems with swallowing at times. The patient is on Stalevo taking the 25/100/200 mg tablets 3 times daily. The patient returns for an evaluation.   Past Medical History  Diagnosis Date  . Dementia   . Parkinson's disease   . Benign positional vertigo   . Hypertension   . Depression   . Cerebrovascular disease   . Dyslipidemia   . Diverticulosis   . Bundle branch block, right   . Mild aortic insufficiency   . History of seizures     Alzheimer's seizures  . Prostate cancer   . Dementia   . Memory loss   . Stroke   . Migraine headache     wife states he does not have migraines    Past Surgical History  Procedure Laterality Date  . Radium seed implants      Prostate cancer  . Cholecystectomy    . Appendectomy    . Tonsillectomy    . Cardiac catheterization      wife states had several years ago and that was clear.  . Shoulder hemi-arthroplasty Left 03/02/2013    Procedure: LEFT SHOULDER HEMI-ARTHROPLASTY;  Surgeon: Augustin Schooling, MD;  Location: [facility];  Service: Orthopedics;  Laterality:  Left;    Family History  Problem Relation Age of Onset  . Heart attack Father     Social history:  reports that he quit smoking about 45 years ago. He has never used smokeless tobacco. He reports that he drinks alcohol. He reports that he does not use illicit drugs.    Allergies  Allergen Reactions  . Aricept [Donepezil Hcl]   . Zetia [Ezetimibe]   . Lipitor [Atorvastatin]     Medications:  Current Outpatient Prescriptions on File Prior to Visit  Medication Sig Dispense Refill  . acetaminophen (TYLENOL) 325 MG tablet Take 325 mg by mouth every 6 (six) hours as needed.      . docusate sodium (COLACE) 100 MG capsule Take 100 mg by mouth 2 (two) times daily.      Marland Kitchen FLUoxetine (PROZAC) 20 MG capsule Take 20 mg by mouth daily.      Marland Kitchen HYDROcodone-acetaminophen (NORCO) 5-325 MG per tablet Take one tablet by mouth every 4 hours as needed for pain  180 tablet  0  . mirtazapine (REMERON) 7.5 MG tablet Take 7.5 mg by mouth at bedtime.      Marland Kitchen oxyCODONE (ROXICODONE) 5 MG immediate release tablet Take one tablet by mouth twice daily for pain  60 tablet  0   No current facility-administered medications on file prior to visit.  ROS:  Out of a complete 14 system review of symptoms, the patient complains only of the following symptoms, and all other reviewed systems are negative.  Gait disturbance  Memory disturbance, confusion Weight loss  Blood pressure 112/71, pulse 88, weight 0 lb (0 kg).  Physical Exam  General: The patient is alert and cooperative at the time of the examination.  Skin: No significant peripheral edema is noted.   Neurologic Exam  Mental status: The patient is oriented to person, otherwise he is unable to participate in the Mini-Mental status examination.  Cranial nerves: Facial symmetry is present. Speech is dysphonic. Extraocular movements are full, with exception of some mild restriction of superior gaze . Visual fields are full.  Motor: The patient has  good strength in all 4 extremities.  Sensory examination: soft sensation is symmetric on the face, arms, and legs.   Coordination: The patient has good finger-nose-finger and heel-to-shin bilaterally.  Gait and station: The requires assistance with standing. Once up, the patient is not able to functionally ambulate. The patient has a tendency to fall backwards. Romberg is positive.  Reflexes: Deep tendon reflexes are symmetric.   Assessment/Plan:  1. Parkinson's disease  2. Gait disturbance  3. Dementia  The patient has a very severe gait disorder, essentially nonambulatory at this time. The patient has had increased confusion. The patient is working with physical therapy, but he is not yet ambulatory. The patient will be increased on the Stalevo taking the 25/125/200 mg tablets 3 times daily. The patient will followup through this office in 4 months.  Jill Alexanders MD 04/15/2013 1:33 PM  [facility] [address] [place], [place] [address]  Phone [phone] Fax [phone]

## 2013-04-23 DIAGNOSIS — F329 Major depressive disorder, single episode, unspecified: Secondary | ICD-10-CM

## 2013-04-23 DIAGNOSIS — E46 Unspecified protein-calorie malnutrition: Secondary | ICD-10-CM | POA: Insufficient documentation

## 2013-04-23 DIAGNOSIS — F0393 Unspecified dementia, unspecified severity, with mood disturbance: Secondary | ICD-10-CM | POA: Insufficient documentation

## 2013-04-23 DIAGNOSIS — F0280 Dementia in other diseases classified elsewhere without behavioral disturbance: Secondary | ICD-10-CM | POA: Insufficient documentation

## 2013-05-02 ENCOUNTER — Other Ambulatory Visit: Payer: Self-pay | Admitting: *Deleted

## 2013-05-02 MED ORDER — OXYCODONE HCL 5 MG PO TABS: ORAL_TABLET | ORAL | Status: DC

## 2013-05-02 NOTE — Telephone Encounter (Signed)
[facility] 

## 2013-05-09 ENCOUNTER — Other Ambulatory Visit: Payer: Self-pay | Admitting: *Deleted

## 2013-05-09 MED ORDER — HYDROCODONE-ACETAMINOPHEN 5-325 MG PO TABS: ORAL_TABLET | ORAL | Status: AC

## 2013-05-09 NOTE — Telephone Encounter (Signed)
[facility] [place] 

## 2013-05-30 ENCOUNTER — Other Ambulatory Visit: Payer: Self-pay | Admitting: *Deleted

## 2013-05-30 MED ORDER — OXYCODONE HCL 5 MG PO TABS: ORAL_TABLET | ORAL | Status: DC

## 2013-05-30 NOTE — Telephone Encounter (Signed)
[facility] [place] 

## 2013-06-01 ENCOUNTER — Non-Acute Institutional Stay (SKILLED_NURSING_FACILITY): Payer: Medicare Other | Admitting: Internal Medicine

## 2013-06-01 ENCOUNTER — Encounter: Payer: Self-pay | Admitting: Internal Medicine

## 2013-06-01 DIAGNOSIS — D509 Iron deficiency anemia, unspecified: Secondary | ICD-10-CM

## 2013-06-01 DIAGNOSIS — M25512 Pain in left shoulder: Secondary | ICD-10-CM

## 2013-06-01 DIAGNOSIS — G20 Parkinson's disease: Secondary | ICD-10-CM

## 2013-06-01 DIAGNOSIS — M25519 Pain in unspecified shoulder: Secondary | ICD-10-CM

## 2013-06-01 DIAGNOSIS — R634 Abnormal weight loss: Secondary | ICD-10-CM

## 2013-06-01 NOTE — Progress Notes (Signed)
Patient ID: [name] [name], male   DOB: 1927/12/17, 80 y.o.   MRN: [id number]    [name] living [facility]  Chief Complaint  Patient presents with  . Medical Management of Chronic Issues   Code status - dnr  HPI 80 y/o male patient is seen for routine visit today. He is very hard of hearing. He has parkinson's disease. He is laying in bed and is in no distress. He complaints of his left shoulder hurting. No other concern from patient. No skin concern. No falls reported. He hasfracture of proximal end of left humerus with malunion and is s/p left shoulder hemiarthroplasty. He is under total care  ROS Unable to obtain from patient No concern from staff  Medication reviewed. See [facility]  Physical exam BP 98/40  Pulse 90  Temp(Src) 98 F (36.7 C)  Resp 18  Ht 5\' 10"  (1.778 m)  Wt 119 lb (53.978 kg)  BMI 17.07 kg/m2  SpO2 98%  Constitutional: elderly male in no distress, frail HENT:   Head: Normocephalic.   Eyes: Pupils are equal, round, and reactive to light.   Neck: Normal ROM   Cardiovascular: Normal rate.    Pulmonary/Chest: Effort normal. CTAB Abdominal: Soft. He exhibits no distension. There is no tenderness.  Musculoskeletal: Pill rolling tremor in right hand, able to move digits Skin: Skin is warm and dry Neurological: follows few commands Psych: oriented to person only   Labs- 03/17/13 wbc 7, hb 8.9, hct 26.9, na 140, k 3.9, glu 84, bun 18, cr 0.81, ca 8.1 02/06/13 wbc 9.6, hb 11.9, hct 33.6, plt 254, na 137, k 3.2, bun 17, cr 0.88  Assessment/plan  Left shoulder pain Change oxycodone to 5 mg po tid and hydrocodone-apap 5-325 to 1 tab q6h prn for now. Reassess if no improvement and consider adding muscle relaxant  Weight loss With progressive parkinson's and dementia related to this, this is anticipated. Eating 40% of his meals. Will increase his remeron to 15 mg po qhs for now and reassess his mood and oral intake. Monitor weight  Anemia Slow decline in h/h.  Will monitor h/h. Likely from his chronic disease. No active bleed at present. Will start him on ferrous sullfate 325 mg bid for now.recheck cbc in 8 weeks  Parkinson's disease Reviewed neurology notes. Continue sinemet tid, monitor gait, monitor behavior. Fall precautions. Aspiration precautions, SLP team following.

## 2013-06-18 DIAGNOSIS — R634 Abnormal weight loss: Secondary | ICD-10-CM | POA: Insufficient documentation

## 2013-06-18 DIAGNOSIS — M25512 Pain in left shoulder: Secondary | ICD-10-CM | POA: Insufficient documentation

## 2013-06-18 DIAGNOSIS — D509 Iron deficiency anemia, unspecified: Secondary | ICD-10-CM | POA: Insufficient documentation

## 2013-07-06 ENCOUNTER — Non-Acute Institutional Stay (SKILLED_NURSING_FACILITY): Payer: Medicare Other | Admitting: Internal Medicine

## 2013-07-06 DIAGNOSIS — M25519 Pain in unspecified shoulder: Secondary | ICD-10-CM

## 2013-07-06 DIAGNOSIS — D509 Iron deficiency anemia, unspecified: Secondary | ICD-10-CM

## 2013-07-06 DIAGNOSIS — G20 Parkinson's disease: Secondary | ICD-10-CM

## 2013-07-06 DIAGNOSIS — F329 Major depressive disorder, single episode, unspecified: Secondary | ICD-10-CM

## 2013-07-06 DIAGNOSIS — E46 Unspecified protein-calorie malnutrition: Secondary | ICD-10-CM

## 2013-07-06 DIAGNOSIS — M25512 Pain in left shoulder: Secondary | ICD-10-CM

## 2013-07-06 DIAGNOSIS — F0393 Unspecified dementia, unspecified severity, with mood disturbance: Secondary | ICD-10-CM

## 2013-07-06 DIAGNOSIS — F0280 Dementia in other diseases classified elsewhere without behavioral disturbance: Secondary | ICD-10-CM

## 2013-07-06 DIAGNOSIS — F3289 Other specified depressive episodes: Secondary | ICD-10-CM

## 2013-07-06 NOTE — Progress Notes (Signed)
Patient ID: [name] [name], male   DOB: 1927/04/20, 79 y.o.   MRN: [id number]    Facility: [facility]  Chief Complaint  Patient presents with  . Medical Management of Chronic Issues   Allergies  Allergen Reactions  . Aricept [Donepezil Hcl]   . Zetia [Ezetimibe]   . Lipitor [Atorvastatin]    HPI 80 y/o male patient is seen today for routine visit. He has dementia, parkinson's disease and is s/p left shoulder hemiarthroplasty. He is in no distress.He is under total care. His wife would like his pain medication reduced and his parkinson's medication adjusted  ROS Unable to obtain from patient No concern from staff  Past Medical History  Diagnosis Date  . Dementia   . Parkinson's disease   . Benign positional vertigo   . Hypertension   . Depression   . Cerebrovascular disease   . Dyslipidemia   . Diverticulosis   . Bundle branch block, right   . Mild aortic insufficiency   . History of seizures     Alzheimer's seizures  . Prostate cancer   . Dementia   . Memory loss   . Stroke   . Migraine headache     wife states he does not have migraines   Medication reviewed. See [facility]  Physical exam Vital signs stable, afebrile, nursing note reviewed  Constitutional: elderly male in no distress, frail HENT:   Head: Normocephalic.   Eyes: Pupils are equal, round, and reactive to light.   Neck: Normal ROM   Cardiovascular: Normal rate.    Pulmonary/Chest: Effort normal. CTAB Abdominal: Soft. He exhibits no distension. There is no tenderness.  Musculoskeletal: Pill rolling tremor in right hand, able to move digits Skin: Skin is warm and dry Neurological: follows few commands Psych: oriented to person only  Labs 03/17/13 wbc 7, hb 8.9, hct 26.9, plt 285, na 140, k 3.9, bun 18, cr 0.81, ca 8.1  Assessment/plan  Left humerus fracture S/p left shoulder arthroplasty. Pain under control with oxycodone 5 mg tid and norco to 5-325 q4h prn for breakthrough  pain. Will decrease oxycodone to 5 mg bid for a week and then once a day for another week and discontinue it. Continue prn norco for pain. Monitor. Fall precautions   Parkinson's disease with dementia Continue sinemet tid. Has slow progression. Will not change sinemet dosing for now. Has tremors. Fall precautions.    Iron def anemia Continue ferrous sulfate, monitor h&h  Depression with dementia Continue fluoxetine 20 mg daily and remeron 15 mg daily for now and monitor  Protein calorie malnutrition Continue remeron to help with mood and appetite for now. Encourage and assist with po intake. Weight stable since last visit.  Wt Readings from Last 3 Encounters:  06/01/13 119 lb (53.978 kg)  03/10/13 120 lb (54.432 kg)  02/03/13 125 lb (56.7 kg)

## 2013-07-19 ENCOUNTER — Other Ambulatory Visit: Payer: Self-pay | Admitting: *Deleted

## 2013-07-19 MED ORDER — OXYCODONE HCL 5 MG PO TABS: ORAL_TABLET | ORAL | Status: DC

## 2013-07-19 MED ORDER — MORPHINE SULFATE (CONCENTRATE) 20 MG/ML PO SOLN: ORAL | Status: DC

## 2013-07-19 NOTE — Telephone Encounter (Signed)
[place]

## 2013-07-31 LAB — BASIC METABOLIC PANEL: Glucose: 95 mg/dL

## 2013-08-04 ENCOUNTER — Non-Acute Institutional Stay (SKILLED_NURSING_FACILITY): Payer: Medicare Other | Admitting: Internal Medicine

## 2013-08-04 DIAGNOSIS — F0280 Dementia in other diseases classified elsewhere without behavioral disturbance: Secondary | ICD-10-CM

## 2013-08-04 DIAGNOSIS — F3289 Other specified depressive episodes: Secondary | ICD-10-CM

## 2013-08-04 DIAGNOSIS — F329 Major depressive disorder, single episode, unspecified: Secondary | ICD-10-CM

## 2013-08-04 DIAGNOSIS — F0393 Unspecified dementia, unspecified severity, with mood disturbance: Secondary | ICD-10-CM

## 2013-08-04 DIAGNOSIS — E46 Unspecified protein-calorie malnutrition: Secondary | ICD-10-CM

## 2013-08-04 DIAGNOSIS — G20 Parkinson's disease: Secondary | ICD-10-CM

## 2013-08-04 NOTE — Progress Notes (Signed)
Facility: [facility]  Chief Complaint  Patient presents with  . Medical Management of Chronic Issues    routine visit   Allergies  Allergen Reactions  . Aricept [Donepezil Hcl]   . Zetia [Ezetimibe]   . Lipitor [Atorvastatin]    HPI 80 y/o male patient is seen today for routine visit. He has dementia, parkinson's disease and is at his baseline.He is under total care. Wheels himself around Needs assistance with feeding at times No other concerns from staff  ROS Unable to obtain from patient  Past Medical History  Diagnosis Date  . Dementia   . Parkinson's disease   . Benign positional vertigo   . Hypertension   . Depression   . Cerebrovascular disease   . Dyslipidemia   . Diverticulosis   . Bundle branch block, right   . Mild aortic insufficiency   . History of seizures     Alzheimer's seizures  . Prostate cancer   . Dementia   . Memory loss   . Stroke   . Migraine headache     wife states he does not have migraines   Current Outpatient Prescriptions on File Prior to Visit  Medication Sig Dispense Refill  . acetaminophen (TYLENOL) 325 MG tablet Take 325 mg by mouth every 6 (six) hours as needed for mild pain, fever or headache.       . carbidopa-levodopa-entacapone (STALEVO 125) 31.25-125-200 MG per tablet Take 1 tablet by mouth 3 (three) times daily.  90 tablet  3  . docusate sodium (COLACE) 100 MG capsule Take 100 mg by mouth 2 (two) times daily. For constipation      . FLUoxetine (PROZAC) 20 MG capsule Take 20 mg by mouth daily. For depression      . HYDROcodone-acetaminophen (NORCO) 5-325 MG per tablet Take one tablet by mouth every 4 hours as needed for pain  180 tablet  0  . mirtazapine (REMERON) 7.5 MG tablet Take 15 mg by mouth at bedtime. For decreased appetite      . morphine (ROXANOL) 20 MG/ML concentrated solution Give 5mg  by mouth every 2 hours as needed for pain or shortness of Breath  120 mL  0  . oxyCODONE (ROXICODONE) 5 MG  immediate release tablet Take one tablet by mouth once daily for pain for 1 week  7 tablet  0   No current facility-administered medications on file prior to visit.   Physical exam BP 99/59  Pulse 81  Temp(Src) 97.5 F (36.4 C)  Resp 20  Ht 5\' 10"  (1.778 m)  Wt 104 lb (47.174 kg)  BMI 14.92 kg/m2  SpO2 98%  Constitutional: elderly male in no distress, frail HENT:   Head: Normocephalic.   Eyes: Pupils are equal, round, and reactive to light.   Neck: Normal ROM   Cardiovascular: Normal rate.    Pulmonary/Chest: Effort normal. CTAB Abdominal: Soft. He exhibits no distension. There is no tenderness.  Musculoskeletal: Pill rolling tremor in right hand, able to move digits Skin: Skin is warm and dry Neurological: follows few commands Psych: oriented to person only  Labs 03/17/13 wbc 7, hb 8.9, hct 26.9, plt 285, na 140, k 3.9, bun 18, cr 0.81, ca 8.1  Assessment/plan  Protein calorie malnutrition Wt Readings from Last 3 Encounters:  08/04/13 104 lb (47.174 kg)  06/01/13 119 lb (53.978 kg)  03/10/13 120 lb (54.432 kg)  continues to lose weight. Decline anticipated with his dementia. Encourage po intake. Continue remeron for now. If  weight continues to decline, will d/c remeron next visit.   Parkinson's disease with dementia Decline anticipated. Continue sinemet tid. Has slow progression. Fall precautions. skin care  Depression with dementia Continue fluoxetine 20 mg daily and remeron 15 mg daily for now and monitor. Stable mood

## 2013-10-31 ENCOUNTER — Non-Acute Institutional Stay (SKILLED_NURSING_FACILITY): Payer: Medicare Other | Admitting: Internal Medicine

## 2013-10-31 DIAGNOSIS — F0281 Dementia in other diseases classified elsewhere with behavioral disturbance: Secondary | ICD-10-CM

## 2013-10-31 DIAGNOSIS — F01518 Vascular dementia, unspecified severity, with other behavioral disturbance: Secondary | ICD-10-CM

## 2013-10-31 DIAGNOSIS — G20 Parkinson's disease: Secondary | ICD-10-CM

## 2013-10-31 DIAGNOSIS — G20A1 Parkinson's disease without dyskinesia, without mention of fluctuations: Secondary | ICD-10-CM

## 2013-10-31 DIAGNOSIS — F0151 Vascular dementia with behavioral disturbance: Secondary | ICD-10-CM

## 2013-10-31 DIAGNOSIS — R635 Abnormal weight gain: Secondary | ICD-10-CM

## 2013-10-31 NOTE — Progress Notes (Signed)
Patient ID: [name] [name], male   DOB: 15-Jul-1927, 80 y.o.   MRN: [id number]  Location:  [facility] Provider:  Jonelle Sidle L. Mariea Clonts, D.O., C.M.D.  Code Status:  DNR  Chief Complaint  Patient presents with  . Acute Visit    HPI:  80 yo white male long term care resident with Parkinson's disease and dementia was seen for acute visit due to weight gain.  He is up 8 lbs over the past 90 days.  He needs assistance with feeding and this is being done here and probably explains his weight gain.  He had initially come here after breaking his left arm.    Review of Systems:  Review of Systems  Constitutional: Negative for fever.  Respiratory: Negative for shortness of breath.   Cardiovascular: Negative for chest pain.  Gastrointestinal: Negative for abdominal pain.  Musculoskeletal: Positive for joint pain.       Left arm  Skin: Negative for rash.  Neurological: Positive for tremors.       Shuffling gait  Psychiatric/Behavioral: Positive for memory loss.    Medications: Patient's Medications  New Prescriptions   No medications on file  Previous Medications   ACETAMINOPHEN (TYLENOL) 325 MG TABLET    Take 325 mg by mouth every 6 (six) hours as needed for mild pain, fever or headache.    CARBIDOPA-LEVODOPA-ENTACAPONE (STALEVO 125) 31.25-125-200 MG PER TABLET    Take 1 tablet by mouth 3 (three) times daily.   DOCUSATE SODIUM (COLACE) 100 MG CAPSULE    Take 100 mg by mouth 2 (two) times daily. For constipation   FERROUS SULFATE 325 (65 FE) MG TABLET    Take 325 mg by mouth 2 (two) times daily with a meal. For anemia   FLUOXETINE (PROZAC) 20 MG CAPSULE    Take 20 mg by mouth daily. For depression   HYDROCODONE-ACETAMINOPHEN (NORCO) 5-325 MG PER TABLET    Take one tablet by mouth every 4 hours as needed for pain   MIRTAZAPINE (REMERON) 7.5 MG TABLET    Take 15 mg by mouth at bedtime. For decreased appetite   MORPHINE (ROXANOL) 20 MG/ML CONCENTRATED SOLUTION    Give 5mg  by mouth  every 2 hours as needed for pain or shortness of Breath   OXYCODONE (ROXICODONE) 5 MG IMMEDIATE RELEASE TABLET    Take one tablet by mouth once daily for pain for 1 week  Modified Medications   No medications on file  Discontinued Medications   No medications on file    Physical Exam: Filed Vitals:   10/31/13 1914  BP: 121/61  Pulse: 72  Temp: 97.7 F (36.5 C)  Resp: 18  Height: 5\' 10"  (1.778 m)  Weight: 115 lb (52.164 kg)  SpO2: 98%   Physical Exam  Constitutional:  Frail white male resting in bed, very pleasant, but confused  Cardiovascular: Normal rate, regular rhythm and normal heart sounds.   Pulmonary/Chest: Effort normal and breath sounds normal.  Abdominal: Soft. Bowel sounds are normal.  Neurological: He is alert.  Resting tremor  Skin: Skin is warm and dry.  To be wearing prevelon boots in bed     Labs reviewed: Basic Metabolic Panel:  Recent Labs  02/19/14 0845  NA 143  K 4.1  CL 108  CO2 27  GLUCOSE 75  BUN 28*  CREATININE 0.88  CALCIUM 8.5    Liver Function Tests:  Recent Labs  02/19/14 0845  AST 15  ALT 14  ALKPHOS 64  BILITOT  0.4  PROT 6.5  ALBUMIN 3.5    CBC:  Recent Labs  02/19/14 0845  WBC 6.3  HGB 10.6*  HCT 31.9*  MCV 94.7  PLT 209   Assessment/Plan 1. Weight gain -on remeron and also due to staff feeding him which he needs -had likely lost weight before admission in context of breaking his arm and from tremors  2. Parkinson's disease -cont stalevo  3. 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

## 2013-12-13 ENCOUNTER — Encounter: Payer: Self-pay | Admitting: Neurology

## 2013-12-19 ENCOUNTER — Encounter: Payer: Self-pay | Admitting: Neurology

## 2014-01-03 ENCOUNTER — Non-Acute Institutional Stay (SKILLED_NURSING_FACILITY): Payer: Medicare Other | Admitting: Adult Health

## 2014-01-03 DIAGNOSIS — F0280 Dementia in other diseases classified elsewhere without behavioral disturbance: Secondary | ICD-10-CM

## 2014-01-03 DIAGNOSIS — F01518 Vascular dementia, unspecified severity, with other behavioral disturbance: Secondary | ICD-10-CM

## 2014-01-03 DIAGNOSIS — F329 Major depressive disorder, single episode, unspecified: Secondary | ICD-10-CM

## 2014-01-03 DIAGNOSIS — F0281 Dementia in other diseases classified elsewhere with behavioral disturbance: Secondary | ICD-10-CM

## 2014-01-03 DIAGNOSIS — F0151 Vascular dementia with behavioral disturbance: Secondary | ICD-10-CM

## 2014-01-03 DIAGNOSIS — R1314 Dysphagia, pharyngoesophageal phase: Secondary | ICD-10-CM

## 2014-01-03 DIAGNOSIS — F0393 Unspecified dementia, unspecified severity, with mood disturbance: Secondary | ICD-10-CM

## 2014-01-03 DIAGNOSIS — G20 Parkinson's disease: Secondary | ICD-10-CM

## 2014-01-03 DIAGNOSIS — D509 Iron deficiency anemia, unspecified: Secondary | ICD-10-CM

## 2014-01-08 ENCOUNTER — Encounter: Payer: Self-pay | Admitting: Adult Health

## 2014-01-08 DIAGNOSIS — R1314 Dysphagia, pharyngoesophageal phase: Secondary | ICD-10-CM | POA: Insufficient documentation

## 2014-01-08 NOTE — Progress Notes (Signed)
Patient ID: SAI ZINN, male   DOB: 06-24-1927, 80 y.o.   MRN: [id number]  [name]     Allergies  Allergen Reactions  . Aricept [Donepezil Hcl]   . Zetia [Ezetimibe]   . Lipitor [Atorvastatin]        Chief Complaint  Patient presents with  . Medical Management of Chronic Issues    HPI:  He is a long term resident of this facility being seen for the management of his chronic illnesses. He has continued to slowly decline over time. He is unable to participate in the hpi or ros. There are no nursing concerns at this time    Past Medical History  Diagnosis Date  . Dementia   . Parkinson's disease   . Benign positional vertigo   . Hypertension   . Depression   . Cerebrovascular disease   . Dyslipidemia   . Diverticulosis   . Bundle branch block, right   . Mild aortic insufficiency   . History of seizures     Alzheimer's seizures  . Prostate cancer   . Dementia   . Memory loss   . Stroke   . Migraine headache     wife states he does not have migraines    Past Surgical History  Procedure Laterality Date  . Radium seed implants      Prostate cancer  . Cholecystectomy    . Appendectomy    . Tonsillectomy    . Cardiac catheterization      wife states had several years ago and that was clear.  . Shoulder hemi-arthroplasty Left 03/02/2013    Procedure: LEFT SHOULDER HEMI-ARTHROPLASTY;  Surgeon: Augustin Schooling, MD;  Location: [place];  Service: Orthopedics;  Laterality: Left;    VITAL SIGNS BP 121/61 mmHg  Pulse 72  Ht 5\' 10"  (1.778 m)  Wt 115 lb (52.164 kg)  BMI 16.50 kg/m2  SpO2 98%   Outpatient Encounter Prescriptions as of 01/03/2014  Medication Sig  . acetaminophen (TYLENOL) 325 MG tablet Take 325 mg by mouth every 6 (six) hours as needed for mild pain, fever or headache.   . carbidopa-levodopa-entacapone (STALEVO 125) 31.25-125-200 MG per tablet Take 1 tablet by mouth 3 (three) times daily.  Marland Kitchen docusate sodium (COLACE) 100 MG capsule Take 100  mg by mouth 2 (two) times daily. For constipation  . ferrous sulfate 325 (65 FE) MG tablet Take 325 mg by mouth 2 (two) times daily with a meal. For anemia  . FLUoxetine (PROZAC) 20 MG capsule Take 20 mg by mouth daily. For depression  . mirtazapine (REMERON) 7.5 MG tablet Take 15 mg by mouth at bedtime. For decreased appetite     SIGNIFICANT DIAGNOSTIC EXAMS   LABS REVIEWED:   07-31-13: wbc 7.9; hgb 10.2; hct 30.8; mcv 88; plt 248; glucose 95; bun 16; creat 0.74; k+3.6; na++138    Review of Systems  Unable to perform ROS    Physical Exam  Constitutional: No distress.  Neck: Neck supple. No JVD present.  Cardiovascular: Normal rate, regular rhythm and intact distal pulses.   Respiratory: Effort normal and breath sounds normal. No respiratory distress.  GI: Soft. Bowel sounds are normal. He exhibits no distension.  Musculoskeletal: He exhibits no edema.  Is able to move extremities   Neurological: He is alert.  Skin: Skin is warm and dry. He is not diaphoretic.       ASSESSMENT/ PLAN:  1. Parkinson disease: no significant change in his status; will continue stalevo 3/0.25/25/200  three times daily and will monitor  2. Dysphagia: no signs of aspiration present. Will continue nectar thick liquids  3. Anemia: will continue iron twice daily   4. Depression: is stable will continue prozac 20 mg daily   5. Mixed vascular and neurodegenerative dementia: there is little change in his status; he is presently not taking medications directly for this disease; will continue remeron 15 mg nightly to help with his appetite. Will monitor   [name] Edwards NP [facility]  Contact [phone] Monday through Friday 8am- 5pm  After hours call [phone]

## 2014-02-12 ENCOUNTER — Non-Acute Institutional Stay (SKILLED_NURSING_FACILITY): Payer: Medicare Other | Admitting: Adult Health

## 2014-02-12 DIAGNOSIS — F329 Major depressive disorder, single episode, unspecified: Secondary | ICD-10-CM

## 2014-02-12 DIAGNOSIS — G20 Parkinson's disease: Secondary | ICD-10-CM

## 2014-02-12 DIAGNOSIS — F0151 Vascular dementia with behavioral disturbance: Secondary | ICD-10-CM

## 2014-02-12 DIAGNOSIS — F0281 Dementia in other diseases classified elsewhere with behavioral disturbance: Secondary | ICD-10-CM

## 2014-02-12 DIAGNOSIS — R1314 Dysphagia, pharyngoesophageal phase: Secondary | ICD-10-CM

## 2014-02-12 DIAGNOSIS — F0280 Dementia in other diseases classified elsewhere without behavioral disturbance: Secondary | ICD-10-CM

## 2014-02-12 DIAGNOSIS — F01518 Vascular dementia, unspecified severity, with other behavioral disturbance: Secondary | ICD-10-CM

## 2014-02-12 DIAGNOSIS — F0393 Unspecified dementia, unspecified severity, with mood disturbance: Secondary | ICD-10-CM

## 2014-02-12 DIAGNOSIS — D509 Iron deficiency anemia, unspecified: Secondary | ICD-10-CM

## 2014-02-19 ENCOUNTER — Other Ambulatory Visit: Payer: Self-pay | Admitting: Adult Health

## 2014-02-19 LAB — CBC
HCT: 31.9 % — ABNORMAL LOW (ref 39.0–52.0)
HEMOGLOBIN: 10.6 g/dL — AB (ref 13.0–17.0)
MCH: 31.5 pg (ref 26.0–34.0)
MCHC: 33.2 g/dL (ref 30.0–36.0)
MCV: 94.7 fL (ref 78.0–100.0)
MPV: 9.3 fL (ref 8.6–12.4)
Platelets: 209 10*3/uL (ref 150–400)
RBC: 3.37 MIL/uL — ABNORMAL LOW (ref 4.22–5.81)
RDW: 13.3 % (ref 11.5–15.5)
WBC: 6.3 10*3/uL (ref 4.0–10.5)

## 2014-02-19 LAB — COMPREHENSIVE METABOLIC PANEL
ALT: 14 U/L (ref 0–53)
AST: 15 U/L (ref 0–37)
Albumin: 3.5 g/dL (ref 3.5–5.2)
Alkaline Phosphatase: 64 U/L (ref 39–117)
BUN: 28 mg/dL — AB (ref 6–23)
CHLORIDE: 108 meq/L (ref 96–112)
CO2: 27 mEq/L (ref 19–32)
Calcium: 8.5 mg/dL (ref 8.4–10.5)
Creat: 0.88 mg/dL (ref 0.50–1.35)
GLUCOSE: 75 mg/dL (ref 70–99)
Potassium: 4.1 mEq/L (ref 3.5–5.3)
Sodium: 143 mEq/L (ref 135–145)
TOTAL PROTEIN: 6.5 g/dL (ref 6.0–8.3)
Total Bilirubin: 0.4 mg/dL (ref 0.2–1.2)

## 2014-03-04 ENCOUNTER — Encounter: Payer: Self-pay | Admitting: Internal Medicine

## 2014-03-12 ENCOUNTER — Other Ambulatory Visit: Payer: Self-pay | Admitting: Adult Health

## 2014-03-13 ENCOUNTER — Encounter: Payer: Self-pay | Admitting: Adult Health

## 2014-03-13 NOTE — Progress Notes (Signed)
Patient ID: [name] [name], male   DOB: [birth year], 81 y.o.   MRN: [id number]  [name] living [place]     Allergies  Allergen Reactions  . Aricept [Donepezil Hcl]   . Zetia [Ezetimibe]   . Lipitor [Atorvastatin]        Chief Complaint  Patient presents with  . Medical Management of Chronic Issues    HPI:  He is a long term resident of this facility being seen for the management of his chronic illnesses. Overall there has been little change in his status over the past month. His weight is stable. He is unable to participate in the hpi or ros. There are no nursing concerns at this time.     Past Medical History  Diagnosis Date  . Dementia   . Parkinson's disease   . Benign positional vertigo   . Hypertension   . Depression   . Cerebrovascular disease   . Dyslipidemia   . Diverticulosis   . Bundle branch block, right   . Mild aortic insufficiency   . History of seizures     Alzheimer's seizures  . Prostate cancer   . Dementia   . Memory loss   . Stroke   . Migraine headache     wife states he does not have migraines    Past Surgical History  Procedure Laterality Date  . Radium seed implants      Prostate cancer  . Cholecystectomy    . Appendectomy    . Tonsillectomy    . Cardiac catheterization      wife states had several years ago and that was clear.  . Shoulder hemi-arthroplasty Left 03/02/2013    Procedure: LEFT SHOULDER HEMI-ARTHROPLASTY;  Surgeon: Augustin Schooling, MD;  Location: [place];  Service: Orthopedics;  Laterality: Left;    VITAL SIGNS BP 122/52 mmHg  Pulse 75  Ht 5\' 10"  (1.778 m)  Wt 117 lb (53.071 kg)  BMI 16.79 kg/m2  SpO2 98%   Outpatient Encounter Prescriptions as of 02/12/2014  Medication Sig  . acetaminophen (TYLENOL) 325 MG tablet Take 325 mg by mouth every 6 (six) hours as needed for mild pain, fever or headache.   . carbidopa-levodopa-entacapone (STALEVO 125) 31.25-125-200 MG per tablet Take 1 tablet by mouth 3 (three) times  daily.  . ferrous sulfate 325 (65 FE) MG tablet Take 325 mg by mouth 2 (two) times daily with a meal. For anemia  . FLUoxetine (PROZAC) 20 MG capsule Take 20 mg by mouth daily. For depression  . food thickener (THICK IT) POWD Take 1 Container by mouth as needed. Nectar thick  . HYDROcodone-acetaminophen (NORCO) 5-325 MG per tablet Take one tablet by mouth every 4 hours as needed for pain  . mirtazapine (REMERON) 7.5 MG tablet Take 15 mg by mouth at bedtime. For decreased appetite  . docusate sodium (COLACE) 100 MG capsule Take 100 mg by mouth 2 (two) times daily. For constipation  . [DISCONTINUED] morphine (ROXANOL) 20 MG/ML concentrated solution Give 5mg  by mouth every 2 hours as needed for pain or shortness of Breath (Patient not taking: Reported on 03/13/2014)  . [DISCONTINUED] oxyCODONE (ROXICODONE) 5 MG immediate release tablet Take one tablet by mouth once daily for pain for 1 week (Patient not taking: Reported on 03/13/2014)     SIGNIFICANT DIAGNOSTIC EXAMS   LABS REVIEWED:   07-31-13: wbc 7.9; hgb 10.2; hct 30.8; mcv 88; plt 248; glucose 95; bun 16; creat 0.74; k+3.6; na++138      Review  of Systems  Unable to perform ROS    Physical Exam Constitutional: No distress. frail  Neck: Neck supple. No JVD present.  Cardiovascular: Normal rate, regular rhythm and intact distal pulses.   Respiratory: Effort normal and breath sounds normal. No respiratory distress.  GI: Soft. Bowel sounds are normal. He exhibits no distension.  Musculoskeletal: He exhibits no edema.  Is able to move extremities   Neurological: He is alert.  Skin: Skin is warm and dry. He is not diaphoretic.     ASSESSMENT/ PLAN:   1. Parkinson disease: no significant change in his status; will continue stalevo 3/0.25/25/200 three times daily and will monitor  2. Dysphagia: no signs of aspiration present. Will continue nectar thick liquids  3. Anemia: will continue iron twice daily   4. Depression: is stable  will continue prozac 20 mg daily   5. Mixed vascular and neurodegenerative dementia: there is little change in his status; he is presently not taking medications directly for this disease; will continue remeron 15 mg nightly to help with his appetite. Will monitor   His weight this month is 117 pounds Dec 2015 was 115 pounds;    Will check cbc cmp      Ok Edwards NP [facility]  Contact [phone] Monday through Friday 8am- 5pm  After hours call [phone]

## 2014-03-14 LAB — URINALYSIS W MICROSCOPIC + REFLEX CULTURE
BILIRUBIN URINE: NEGATIVE
CASTS: NONE SEEN
Glucose, UA: NEGATIVE mg/dL
Hgb urine dipstick: NEGATIVE
KETONES UR: NEGATIVE mg/dL
NITRITE: NEGATIVE
RBC / HPF: NONE SEEN RBC/hpf (ref <3)
Specific Gravity, Urine: 1.01 (ref 1.005–1.030)
UROBILINOGEN UA: 0.2 mg/dL (ref 0.0–1.0)
pH: >9 — ABNORMAL HIGH (ref 5.0–8.0)

## 2014-03-17 LAB — URINE CULTURE: Colony Count: >=100000

## 2014-03-20 ENCOUNTER — Non-Acute Institutional Stay (SKILLED_NURSING_FACILITY): Payer: Medicare Other | Admitting: Adult Health

## 2014-03-20 DIAGNOSIS — R627 Adult failure to thrive: Secondary | ICD-10-CM

## 2014-03-20 DIAGNOSIS — F329 Major depressive disorder, single episode, unspecified: Secondary | ICD-10-CM | POA: Diagnosis not present

## 2014-03-20 DIAGNOSIS — D509 Iron deficiency anemia, unspecified: Secondary | ICD-10-CM

## 2014-03-20 DIAGNOSIS — F0393 Unspecified dementia, unspecified severity, with mood disturbance: Secondary | ICD-10-CM

## 2014-03-20 DIAGNOSIS — R1314 Dysphagia, pharyngoesophageal phase: Secondary | ICD-10-CM | POA: Diagnosis not present

## 2014-03-20 DIAGNOSIS — F01518 Vascular dementia, unspecified severity, with other behavioral disturbance: Secondary | ICD-10-CM

## 2014-03-20 DIAGNOSIS — G20 Parkinson's disease: Secondary | ICD-10-CM

## 2014-03-20 DIAGNOSIS — F0280 Dementia in other diseases classified elsewhere without behavioral disturbance: Secondary | ICD-10-CM | POA: Diagnosis not present

## 2014-03-20 DIAGNOSIS — F0281 Dementia in other diseases classified elsewhere with behavioral disturbance: Secondary | ICD-10-CM | POA: Diagnosis not present

## 2014-03-20 DIAGNOSIS — F0151 Vascular dementia with behavioral disturbance: Secondary | ICD-10-CM

## 2014-04-05 ENCOUNTER — Encounter: Payer: Self-pay | Admitting: Adult Health

## 2014-04-05 DIAGNOSIS — R627 Adult failure to thrive: Secondary | ICD-10-CM | POA: Insufficient documentation

## 2014-04-05 NOTE — Progress Notes (Signed)
Patient ID: [name] [name], male   DOB: October 12, 1927, 81 y.o.   MRN: [id number]  [name] [name] living [name] [name]     Allergies  Allergen Reactions  . Aricept [Donepezil Hcl]   . Zetia [Ezetimibe]   . Lipitor [Atorvastatin]        Chief Complaint  Patient presents with  . Annual Exam    HPI:  He is a long term resident of this facility being seen for his annual exam. He has not had any recent hospitalizations. He is presently being treated for an uti. He is unable to fully participate in the hpi or ros. There are no nursing concerns today.    Past Medical History  Diagnosis Date  . Dementia   . Parkinson's disease   . Benign positional vertigo   . Hypertension   . Depression   . Cerebrovascular disease   . Dyslipidemia   . Diverticulosis   . Bundle branch block, right   . Mild aortic insufficiency   . History of seizures     Alzheimer's seizures  . Prostate cancer   . Dementia   . Memory loss   . Stroke   . Migraine headache     wife states he does not have migraines    Past Surgical History  Procedure Laterality Date  . Radium seed implants      Prostate cancer  . Cholecystectomy    . Appendectomy    . Tonsillectomy    . Cardiac catheterization      wife states had several years ago and that was clear.  . Shoulder hemi-arthroplasty Left 03/02/2013    Procedure: LEFT SHOULDER HEMI-ARTHROPLASTY;  Surgeon: Augustin Schooling, MD;  Location: [facility];  Service: Orthopedics;  Laterality: Left;    VITAL SIGNS BP 92/54 mmHg  Pulse 68  Ht 5\' 10"  (1.778 m)  Wt 118 lb (53.524 kg)  BMI 16.93 kg/m2  SpO2 98%   Outpatient Encounter Prescriptions as of 03/20/2014  Medication Sig  . acetaminophen (TYLENOL) 325 MG tablet Take 325 mg by mouth every 6 (six) hours as needed for mild pain, fever or headache.   . carbidopa-levodopa-entacapone (STALEVO 125) 31.25-125-200 MG per tablet Take 1 tablet by mouth 3 (three) times daily.  Marland Kitchen docusate sodium (COLACE) 100 MG capsule Take 100  mg by mouth 2 (two) times daily. For constipation  . ferrous sulfate 325 (65 FE) MG tablet Take 325 mg by mouth 2 (two) times daily with a meal. For anemia  . FLUoxetine (PROZAC) 20 MG capsule Take 20 mg by mouth daily. For depression  . food thickener (THICK IT) POWD Take 1 Container by mouth as needed. Nectar thick  . HYDROcodone-acetaminophen (NORCO) 5-325 MG per tablet Take one tablet by mouth every 4 hours as needed for pain   restoril 7.5 mg  7.5 mg nightly as needed   . mirtazapine (REMERON) 7.5 MG tablet Take 15mg  by mouth at bedtime . For decreased appetite     SIGNIFICANT DIAGNOSTIC EXAMS   LABS REVIEWED:   07-31-13: wbc 7.9; hgb 10.2; hct 30.8; mcv 88; plt 248; glucose 95; bun 16; creat 0.74; k+3.6; na++138 02-19-14: wbc 6.3 hgb 10.6; hct 31.9; mcv 94.7; plt 209; glucose 75; bun 28; creat 0.88; k+4.1; na++143; liver normal albumin 3.5 03-17-14: urine culture: proteus mirabilis: augmentin      ROS Unable to perform ROS   Physical Exam Constitutional: No distress. frail  Neck: Neck supple. No JVD present.  Cardiovascular: Normal rate, regular rhythm and intact  distal pulses.   Respiratory: Effort normal and breath sounds normal. No respiratory distress.  GI: Soft. Bowel sounds are normal. He exhibits no distension.  Musculoskeletal: He exhibits no edema.  Is able to move extremities   Neurological: He is alert.  Skin: Skin is warm and dry. He is not diaphoretic.       ASSESSMENT/ PLAN:   1. Parkinson disease: no significant change in his status; will continue stalevo 3/0.25/25/200 three times daily and will monitor  2. Dysphagia: no signs of aspiration present. Will continue nectar thick liquids  3. Anemia: will continue iron twice daily   4. Depression: is stable will continue prozac 20 mg daily   5. Mixed vascular and neurodegenerative dementia: there is little change in his status; he is presently not taking medications directly for this disease; Will  monitor     6. FTT: weight this month is 118 pounds Dec 2015 was 115 pounds   will continue remeron 15 mg nightly to help with his appetite.  In the late stages of dementia weight loss is expected Will monitor   His health maintenance is up to date.    [name] Edwards NP [facility]  Contact [phone] Monday through Friday 8am- 5pm  After hours call [phone]

## 2014-04-27 DEATH — deceased

## 2015-08-15 IMAGING — CR DG PELVIS 1-2V
1 series · 1 of 1 positions shown · non-contrast
Comparison: None.

CLINICAL DATA: Fall with humerus pain and pelvic pain.

EXAM:
PELVIS - 1-2 VIEW

[x pelvis]
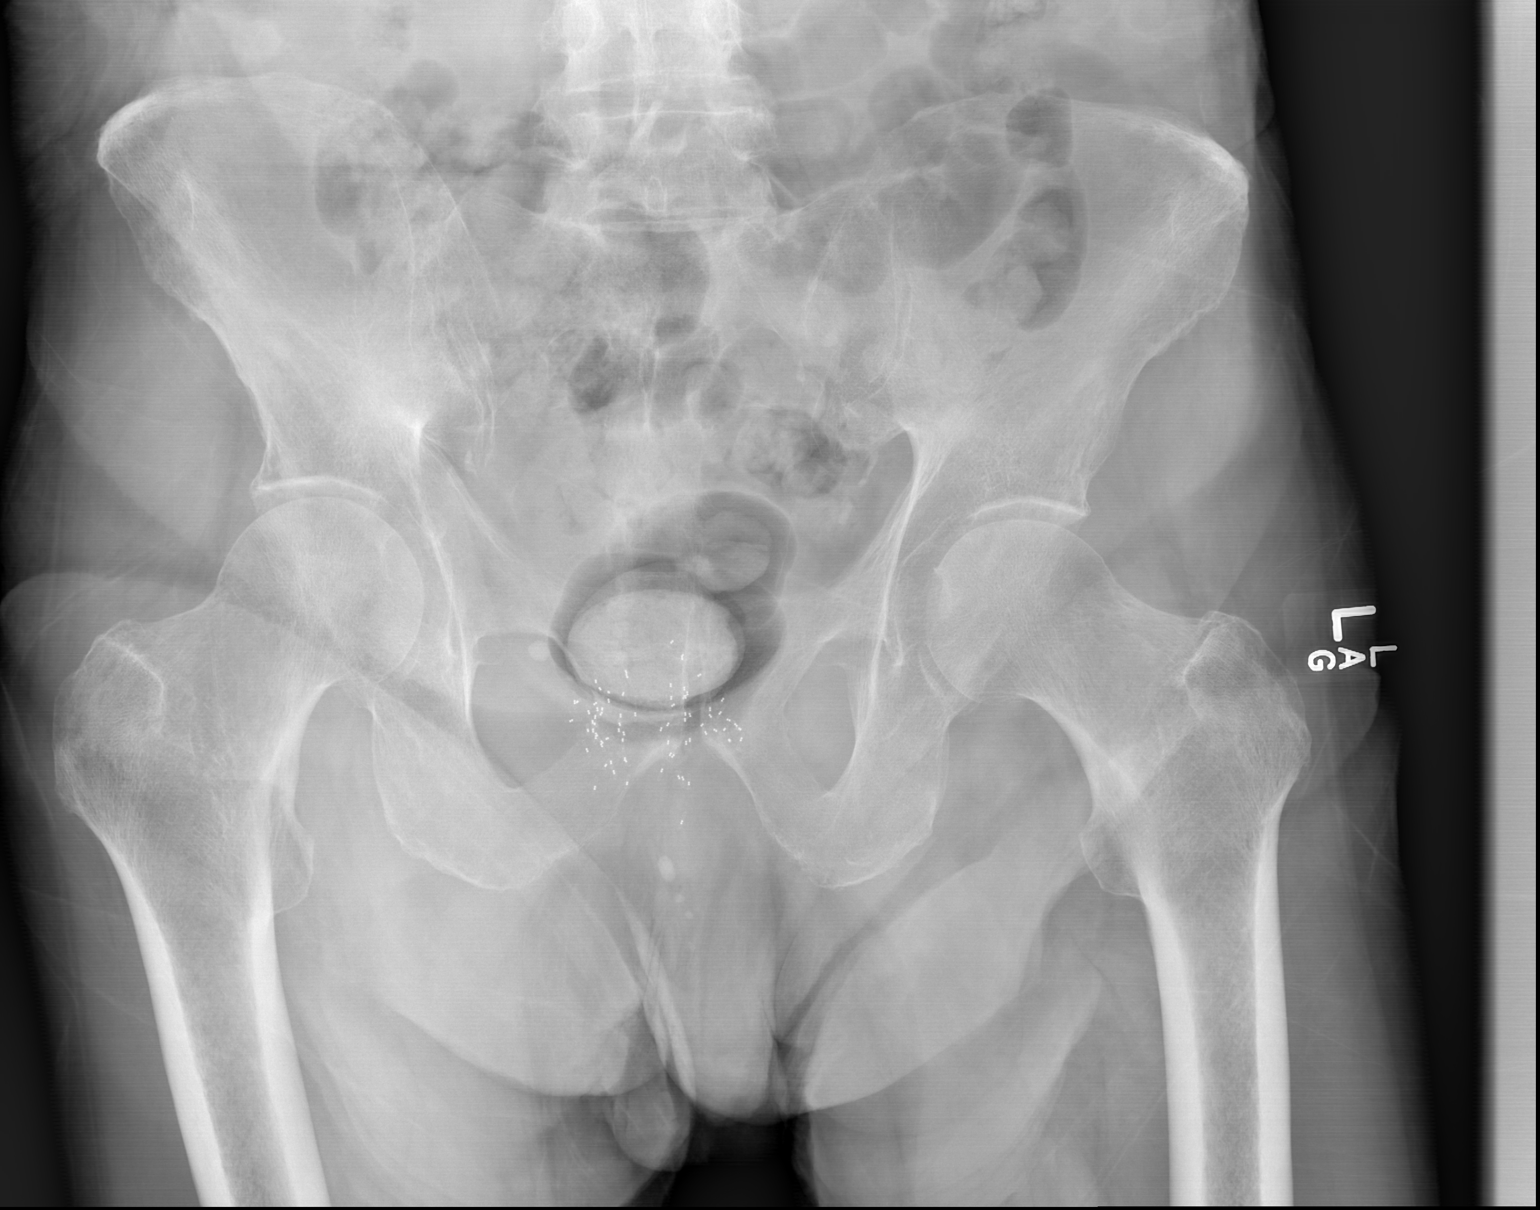

[1 of 1 positions shown; findings below may reference images not displayed]

FINDINGS: There is no evidence of pelvic fracture or diastasis. No other
pelvic bone lesions are seen. Prostate brachytherapy seeds.
IMPRESSION: No evidence of osseous injury.

## 2015-08-15 IMAGING — CR DG HUMERUS 2V *L*
2 series · 2 of 2 positions shown · non-contrast
Comparison: 01/31/2013

CLINICAL DATA: Fall with known fracture

EXAM:
LEFT HUMERUS - 2+ VIEW

[x humerus ap left (1 of 2)]
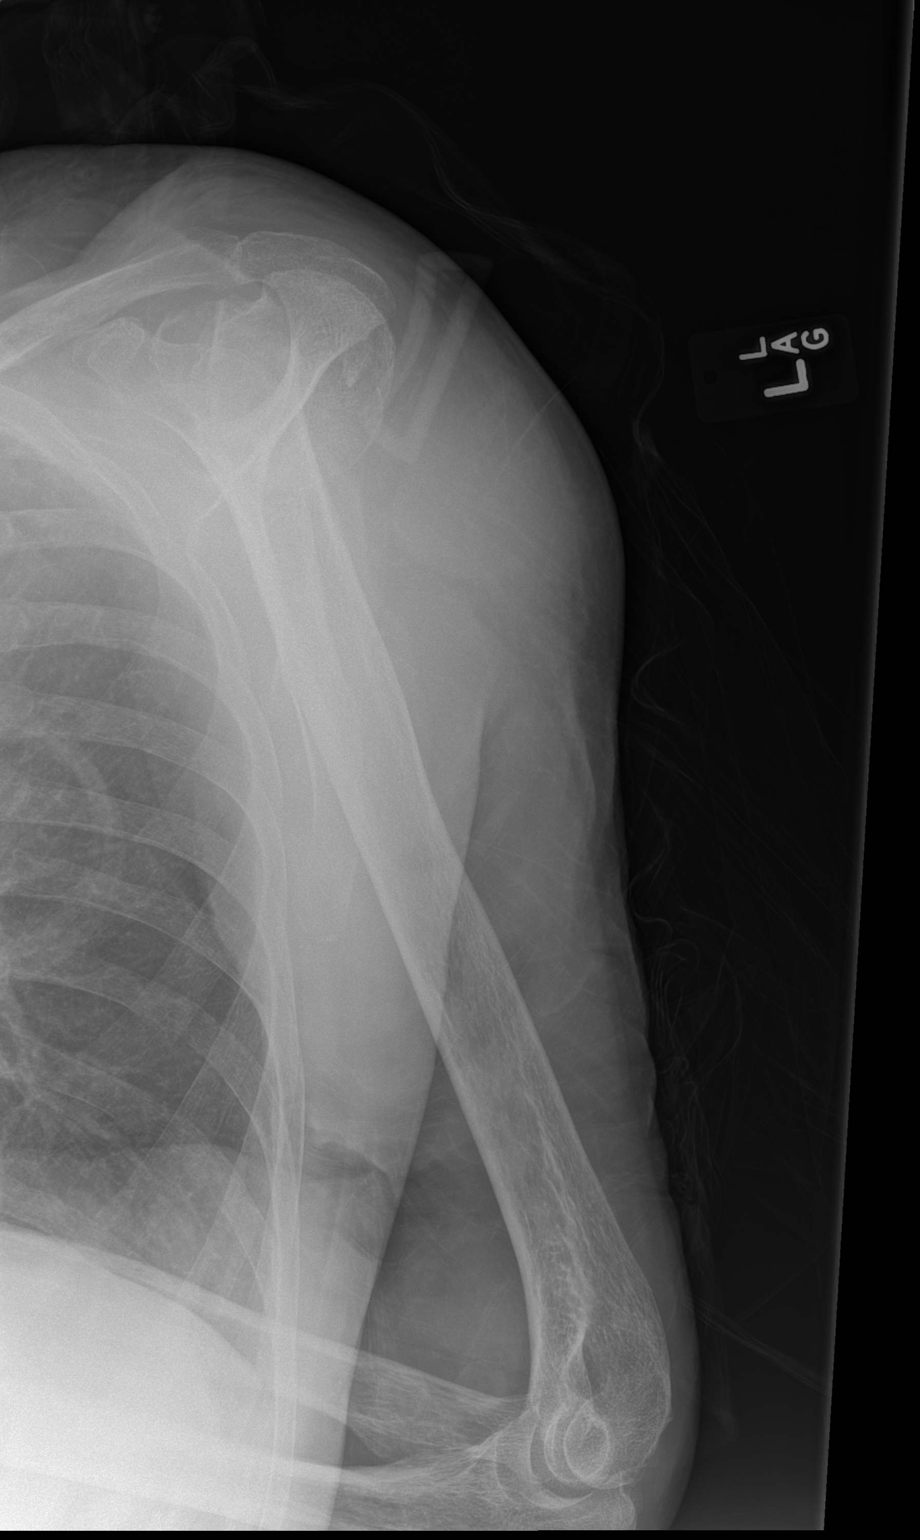

[x humerus ap left (2 of 2)]
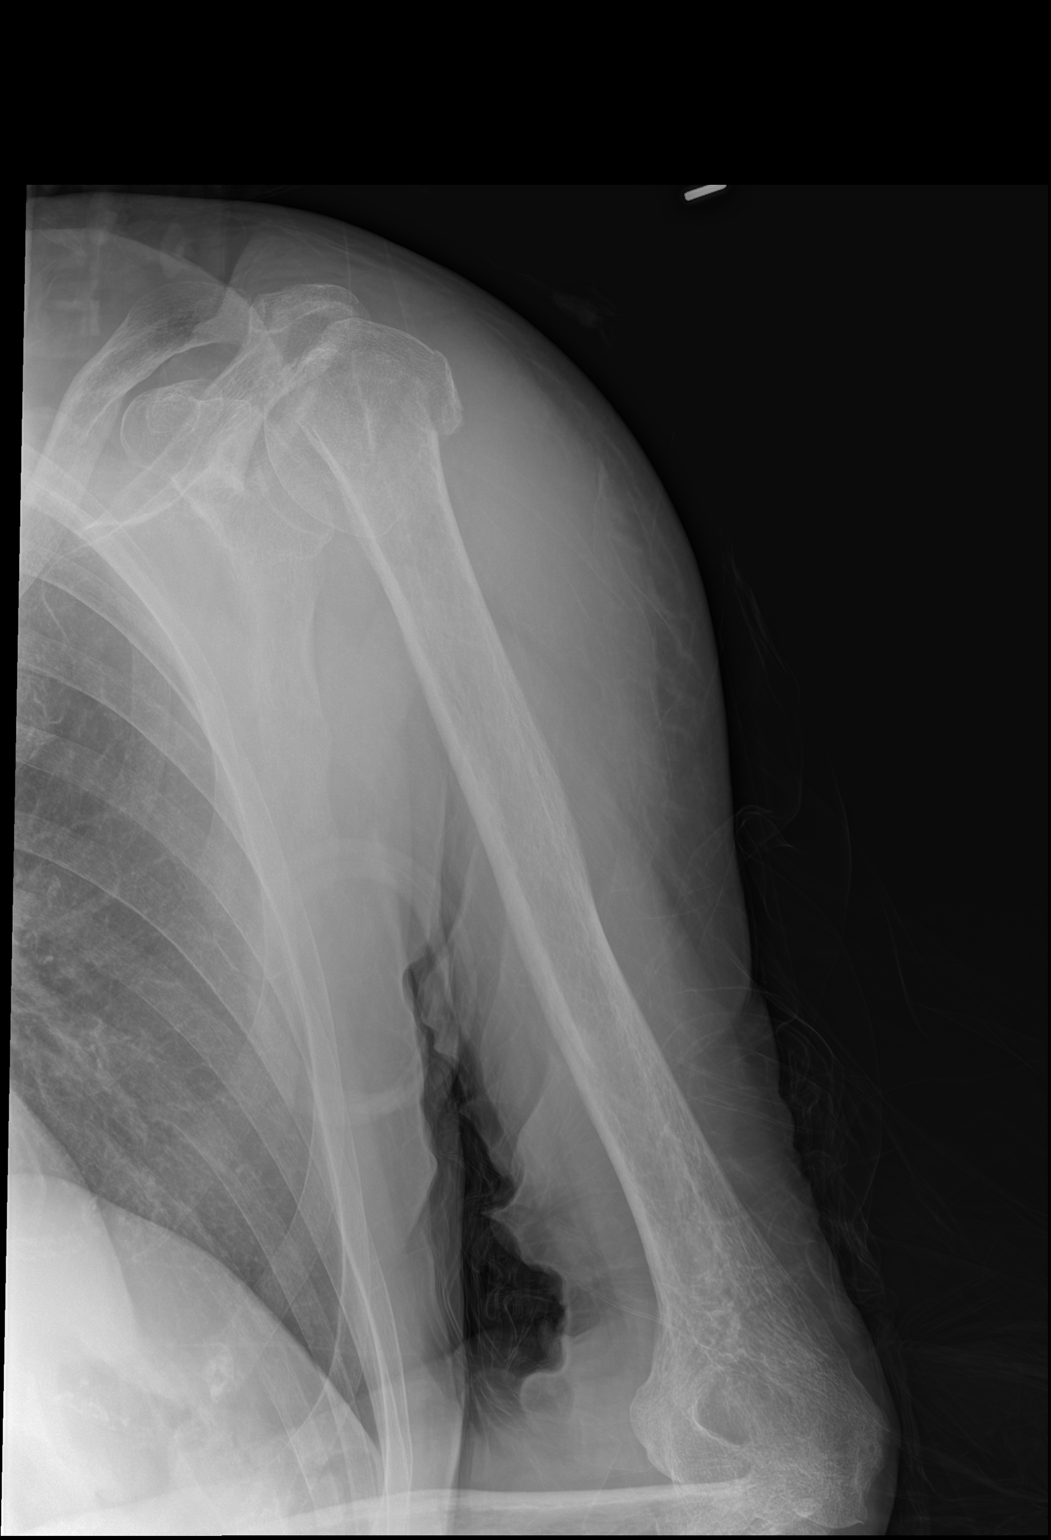

[2 of 2 positions shown; findings below may reference images not displayed]

FINDINGS: Acute fracture through the surgical neck of the humerus. Given
differences in obliquity, displacement and impaction is relatively
similar, with the distal fragment anteriorly located. The
glenohumeral joint remains located. No new fracture seen.
Osteopenia.
IMPRESSION: Known surgical neck left humerus fracture. Displacement and
impaction is similar to previous. No new fracture.

## 2015-08-15 IMAGING — CT CT CERVICAL SPINE W/O CM
2 of 4 series · 6 of 14 positions shown, 7 images · non-contrast
Comparison: CT of the head and cervical spine January 31, 2013.

CLINICAL DATA: Found down, unconscious with forehead laceration.

EXAM:
CT HEAD WITHOUT CONTRAST
CT CERVICAL SPINE WITHOUT CONTRAST
TECHNIQUE: Multidetector CT imaging of the head and cervical spine was
performed following the standard protocol without intravenous
contrast. Multiplanar CT image reconstructions of the cervical spine
were also generated.

[Series 5: c-spine st · axial · 0.29mm/px · z∈[-201,-117]mm · 3 of 85 slices shown, 4 images]
[im 22/85  soft-tissue]
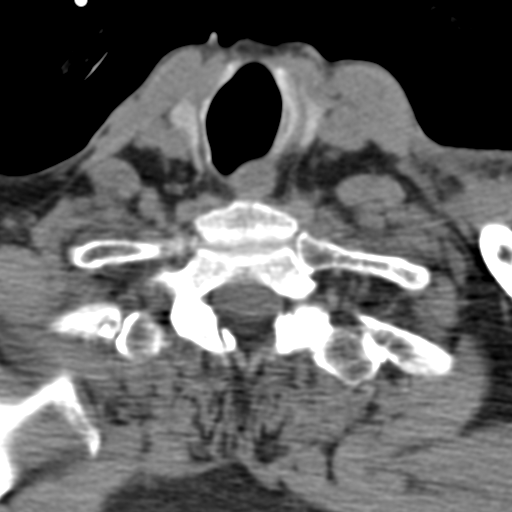
[im 22/85  bone]
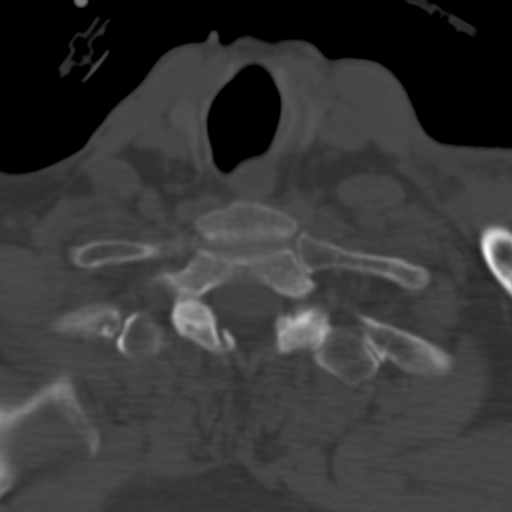
[im 43/85  bone]
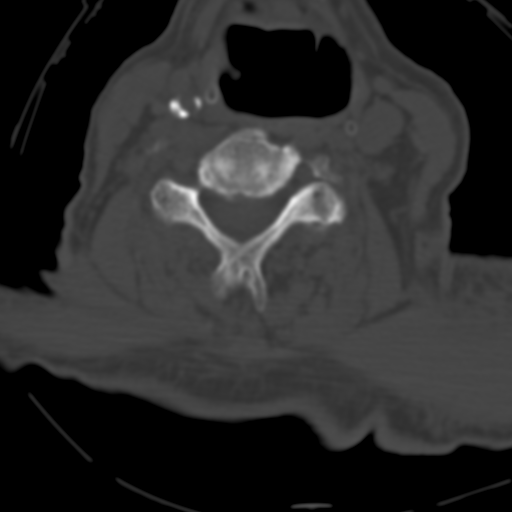
[im 64/85  bone]
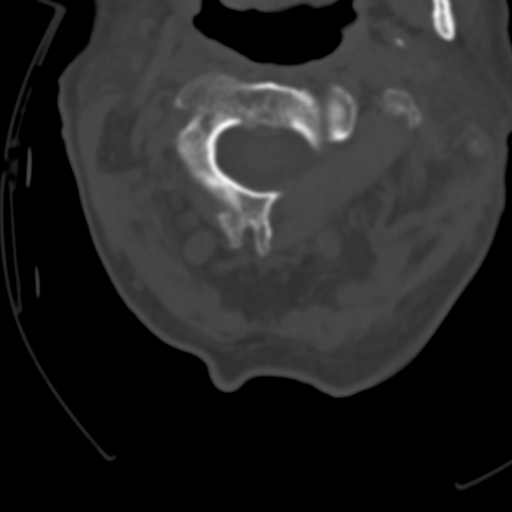

[Series 7: axial recon · axial · 0.20mm/px · z∈[-212,-129]mm · 3 of 89 slices shown]
[im 23/89  bone]
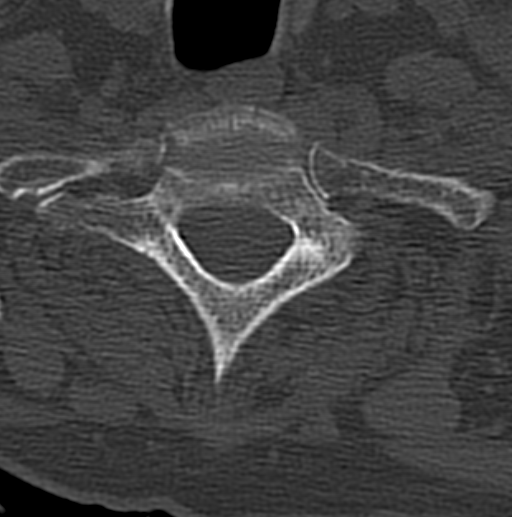
[im 45/89  bone]
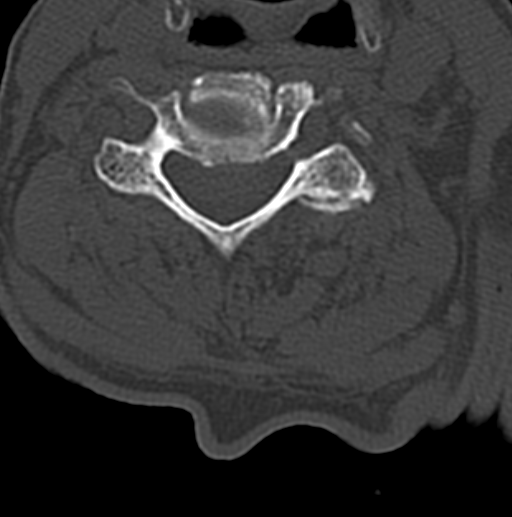
[im 67/89  bone]
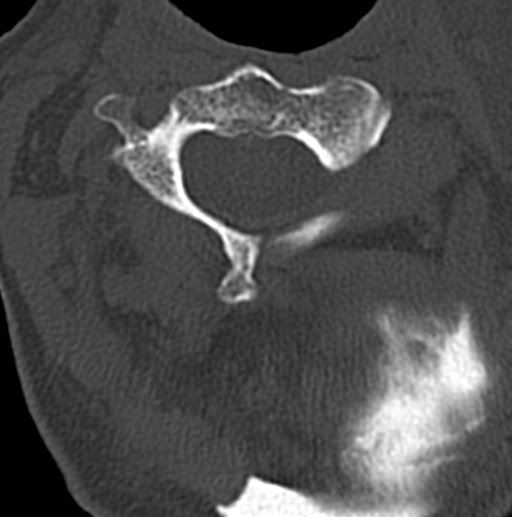

[6 of 14 positions shown; findings below may reference images not displayed]

FINDINGS: CT HEAD FINDINGS

The ventricles and sulci are normal for age. No intraparenchymal
hemorrhage, mass effect nor midline shift. Patchy supratentorial
white matter hypodensities are within normal range for patient's age
and though non-specific suggest sequelae of chronic small vessel
ischemic disease. No acute large vascular territory infarcts.

No abnormal extra-axial fluid collections. Basal cisterns are
patent. Moderate calcific atherosclerosis of the carotid siphons.

No skull fracture. Visualized paranasal sinuses and mastoid aircells
are well-aerated. The included ocular globes and orbital contents
are non-suspicious. The patient is edentulous. Moderate
temporomandibular osteoarthrosis.

CT CERVICAL SPINE FINDINGS

Cervical vertebral bodies and posterior elements are intact and
aligned with straightened cervical lordosis. Moderate to severe C4-5
through C6-7 degenerative disc disease. C1-2 articulation maintained
with moderate arthropathy. No destructive bony lesions. Moderate
right, mild left carotid bifurcations calcification. Paraspinal soft
tissues are nonsuspicious.

Degenerative disc disease and facet arthropathy result in mild canal
stenosis at see 5 6. Moderate to severe left C3-4, left C5-6 and
right C6-7 neural foraminal narrowing.
IMPRESSION: CT head: No acute intracranial process.

Involutional changes. Mild to moderate white matter changes suggest
chronic small vessel ischemic disease.

CT cervical spine: Straightened cervical lordosis without acute
fracture deformity nor malalignment.

  By: Mihael Apa Arih
